# Patient Record
Sex: Male | Born: 1965 | Race: White | Hispanic: No | Marital: Married | Smoking: Former smoker
Health system: Southern US, Community
[De-identification: ages and names within clinical notes are randomized; demographics above are authoritative.]

## PROBLEM LIST (undated history)

## (undated) DIAGNOSIS — F329 Major depressive disorder, single episode, unspecified: Secondary | ICD-10-CM

## (undated) DIAGNOSIS — T753XXA Motion sickness, initial encounter: Secondary | ICD-10-CM

## (undated) DIAGNOSIS — F419 Anxiety disorder, unspecified: Secondary | ICD-10-CM

## (undated) DIAGNOSIS — N529 Male erectile dysfunction, unspecified: Secondary | ICD-10-CM

## (undated) DIAGNOSIS — L719 Rosacea, unspecified: Secondary | ICD-10-CM

## (undated) DIAGNOSIS — G4730 Sleep apnea, unspecified: Secondary | ICD-10-CM

## (undated) DIAGNOSIS — E349 Endocrine disorder, unspecified: Secondary | ICD-10-CM

## (undated) HISTORY — PX: HERNIA REPAIR: SHX51

## (undated) HISTORY — PX: APPENDECTOMY: SHX54

---
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
## 2004-08-13 ENCOUNTER — Emergency Department: Payer: Self-pay | Admitting: General Practice

## 2007-04-22 ENCOUNTER — Inpatient Hospital Stay: Payer: Self-pay | Admitting: Surgery

## 2013-12-26 DIAGNOSIS — G4733 Obstructive sleep apnea (adult) (pediatric): Secondary | ICD-10-CM | POA: Insufficient documentation

## 2013-12-26 DIAGNOSIS — R7989 Other specified abnormal findings of blood chemistry: Secondary | ICD-10-CM | POA: Insufficient documentation

## 2015-03-12 ENCOUNTER — Encounter: Payer: Self-pay | Admitting: *Deleted

## 2015-03-15 ENCOUNTER — Ambulatory Visit: Admission: RE | Admit: 2015-03-15 | Source: Ambulatory Visit | Admitting: Unknown Physician Specialty

## 2015-03-15 ENCOUNTER — Encounter: Admission: RE | Payer: Self-pay | Source: Ambulatory Visit

## 2015-03-15 HISTORY — DX: Endocrine disorder, unspecified: E34.9

## 2015-03-15 HISTORY — DX: Male erectile dysfunction, unspecified: N52.9

## 2015-03-15 HISTORY — DX: Rosacea, unspecified: L71.9

## 2015-03-15 SURGERY — COLONOSCOPY WITH PROPOFOL
Anesthesia: General

## 2015-04-09 ENCOUNTER — Encounter: Payer: Self-pay | Admitting: *Deleted

## 2015-04-12 ENCOUNTER — Encounter
Admission: RE | Disposition: A | Discharge status: Home / Self Care | Payer: Self-pay | Source: Ambulatory Visit | Attending: Unknown Physician Specialty

## 2015-04-12 ENCOUNTER — Ambulatory Visit: Admitting: Anesthesiology

## 2015-04-12 ENCOUNTER — Ambulatory Visit
Admission: RE | Admit: 2015-04-12 | Discharge: 2015-04-12 | Disposition: A | Discharge status: Home / Self Care | Source: Ambulatory Visit | Attending: Unknown Physician Specialty | Admitting: Unknown Physician Specialty

## 2015-04-12 DIAGNOSIS — Z79899 Other long term (current) drug therapy: Secondary | ICD-10-CM | POA: Diagnosis not present

## 2015-04-12 DIAGNOSIS — L719 Rosacea, unspecified: Secondary | ICD-10-CM | POA: Diagnosis not present

## 2015-04-12 DIAGNOSIS — Z1211 Encounter for screening for malignant neoplasm of colon: Secondary | ICD-10-CM | POA: Insufficient documentation

## 2015-04-12 DIAGNOSIS — D120 Benign neoplasm of cecum: Secondary | ICD-10-CM | POA: Diagnosis not present

## 2015-04-12 DIAGNOSIS — E291 Testicular hypofunction: Secondary | ICD-10-CM | POA: Diagnosis not present

## 2015-04-12 DIAGNOSIS — K640 First degree hemorrhoids: Secondary | ICD-10-CM | POA: Insufficient documentation

## 2015-04-12 HISTORY — PX: COLONOSCOPY WITH PROPOFOL: SHX5780

## 2015-04-12 SURGERY — COLONOSCOPY WITH PROPOFOL
Anesthesia: General

## 2015-04-12 MED ORDER — FENTANYL CITRATE (PF) 100 MCG/2ML IJ SOLN
INTRAMUSCULAR | Status: DC | PRN
  Administered 2015-04-12: 50 ug via INTRAVENOUS

## 2015-04-12 MED ORDER — PROPOFOL 10 MG/ML IV BOLUS
INTRAVENOUS | Status: DC | PRN
  Administered 2015-04-12: 43.1 mg via INTRAVENOUS

## 2015-04-12 MED ORDER — SODIUM CHLORIDE 0.9 % IV SOLN
INTRAVENOUS | Status: DC
  Administered 2015-04-12: 12:00:00 via INTRAVENOUS

## 2015-04-12 MED ORDER — SODIUM CHLORIDE 0.9 % IV SOLN: INTRAVENOUS | Status: DC

## 2015-04-12 MED ORDER — MIDAZOLAM HCL 5 MG/5ML IJ SOLN
INTRAMUSCULAR | Status: DC | PRN
  Administered 2015-04-12: 1 mg via INTRAVENOUS

## 2015-04-12 MED ORDER — PROPOFOL 500 MG/50ML IV EMUL
INTRAVENOUS | Status: DC | PRN
  Administered 2015-04-12: 120 ug/kg/min via INTRAVENOUS

## 2015-04-12 NOTE — Op Note (Signed)
[facility] Gastroenterology Patient Name: Derek Ford Procedure Date: 04/12/2015 12:34 PM MRN: [id number] Account #: [id number] Date of Birth: 1965-06-18 Admit Type: Outpatient Age: 50 Room: [facility] ENDO ROOM 1 Gender: Male Note Status: Finalized Procedure:         Colonoscopy Indications:       Screening for colorectal malignant neoplasm Providers:         Manya Silvas, MD Referring MD:      Caprice Renshaw (Referring MD) Medicines:         Propofol per Anesthesia Complications:     No immediate complications. Procedure:         Pre-Anesthesia Assessment:                    - After reviewing the risks and benefits, the patient was                     deemed in satisfactory condition to undergo the procedure.                    After obtaining informed consent, the colonoscope was                     passed under direct vision. Throughout the procedure, the                     patient's blood pressure, pulse, and oxygen saturations                     were monitored continuously. The Colonoscope was                     introduced through the anus and advanced to the the cecum,                     identified by appendiceal orifice and ileocecal valve. The                     colonoscopy was performed without difficulty. The patient                     tolerated the procedure well. The quality of the bowel                     preparation was excellent. Findings:      A diminutive polyp was found in the cecum. The polyp was sessile. The       polyp was removed with a cold biopsy forceps. Resection and retrieval       were complete.      Internal hemorrhoids were found during endoscopy. The hemorrhoids were       small and Grade I (internal hemorrhoids that do not prolapse).      The exam was otherwise without abnormality. Impression:        - One diminutive polyp in the cecum. Resected and                     retrieved.                    - Internal  hemorrhoids.                    - The examination was otherwise normal. Recommendation:    - Await pathology results. Manya Silvas, MD  04/12/2015 1:19:51 PM This report has been signed electronically. Number of Addenda: 0 Note Initiated On: 04/12/2015 12:34 PM Scope Withdrawal Time: 0 hours 11 minutes 26 seconds  Total Procedure Duration: 0 hours 17 minutes 9 seconds       [facility]

## 2015-04-12 NOTE — Anesthesia Preprocedure Evaluation (Signed)
Anesthesia Evaluation  Patient identified by MRN, date of birth, ID band Patient awake    Reviewed: Allergy & Precautions, NPO status , Patient's Chart, lab work & pertinent test results  History of Anesthesia Complications Negative for: history of anesthetic complications  Airway Mallampati: II       Dental   Pulmonary neg pulmonary ROS,           Cardiovascular negative cardio ROS       Neuro/Psych negative neurological ROS     GI/Hepatic negative GI ROS, Neg liver ROS,   Endo/Other  negative endocrine ROS  Renal/GU negative Renal ROS     Musculoskeletal   Abdominal   Peds  Hematology negative hematology ROS (+)   Anesthesia Other Findings   Reproductive/Obstetrics                             Anesthesia Physical Anesthesia Plan  ASA: I  Anesthesia Plan: General   Post-op Pain Management:    Induction: Intravenous  Airway Management Planned: Nasal Cannula  Additional Equipment:   Intra-op Plan:   Post-operative Plan:   Informed Consent: I have reviewed the patients History and Physical, chart, labs and discussed the procedure including the risks, benefits and alternatives for the proposed anesthesia with the patient or authorized representative who has indicated his/her understanding and acceptance.     Plan Discussed with:   Anesthesia Plan Comments:         Anesthesia Quick Evaluation  

## 2015-04-12 NOTE — H&P (Signed)
   Primary Care Physician:  Marcello Fennel, MD Primary Gastroenterologist:  Dr. Vira Agar  Pre-Procedure History & Physical: HPI:  Derek Ford is a 50 y.o. male is here for an colonoscopy.   Past Medical History  Diagnosis Date  . Erectile dysfunction   . Hypotestosteronism   . Rosacea     Past Surgical History  Procedure Laterality Date  . Hernia repair    . Appendectomy      Prior to Admission medications   Medication Sig Start Date End Date Taking? Authorizing Provider  Ginkgo Biloba 40 MG TABS Take 40 mg by mouth daily.    Historical Provider, MD  Multiple Vitamin (MULTIVITAMIN) capsule Take 1 capsule by mouth daily.    Historical Provider, MD  omega-3 acid ethyl esters (LOVAZA) 1 g capsule Take 2 g by mouth daily.    Historical Provider, MD  sildenafil (VIAGRA) 100 MG tablet Take 100 mg by mouth as needed for erectile dysfunction.    Historical Provider, MD  Testosterone (FORTESTA) 10 MG/ACT (2%) GEL Place 10 applicators onto the skin daily.    Historical Provider, MD  Testosterone 10 MG/ACT (2%) GEL Place onto the skin.    Historical Provider, MD    Allergies as of 03/22/2015  . (No Known Allergies)    History reviewed. No pertinent family history.  Social History   Social History  . Marital Status: Married    Spouse Name: N/A  . Number of Children: N/A  . Years of Education: N/A   Occupational History  . Not on file.   Social History Main Topics  . Smoking status: Not on file  . Smokeless tobacco: Not on file  . Alcohol Use: No  . Drug Use: No  . Sexual Activity: Not on file   Other Topics Concern  . Not on file   Social History Narrative    Review of Systems: See HPI, otherwise negative ROS  Physical Exam: BP 121/90 mmHg  Pulse 83  Temp(Src) 98.4 F (36.9 C) (Tympanic)  Resp 19  Ht 6\' 1"  (1.854 m)  Wt 86.183 kg (190 lb)  BMI 25.07 kg/m2  SpO2 100% General:   Alert,  pleasant and cooperative in NAD Head:  Normocephalic and  atraumatic. Neck:  Supple; no masses or thyromegaly. Lungs:  Clear throughout to auscultation.    Heart:  Regular rate and rhythm. Abdomen:  Soft, nontender and nondistended. Normal bowel sounds, without guarding, and without rebound.   Neurologic:  Alert and  oriented x4;  grossly normal neurologically.  Impression/Plan: [name] is here for an colonoscopy to be performed for screening  Risks, benefits, limitations, and alternatives regarding  colonoscopy have been reviewed with the patient.  Questions have been answered.  All parties agreeable.   Gaylyn Cheers, MD  04/12/2015, 12:52 PM

## 2015-04-12 NOTE — Transfer of Care (Signed)
Immediate Anesthesia Transfer of Care Note  Patient: Derek Ford  Procedure(s) Performed: Procedure(s): COLONOSCOPY WITH PROPOFOL (N/A)  Patient Location: PACU  Anesthesia Type:General  Level of Consciousness: sedated  Airway & Oxygen Therapy: Patient Spontanous Breathing  Post-op Assessment: Report given to RN  Post vital signs: Reviewed  Last Vitals:  Filed Vitals:   04/12/15 1157 04/12/15 1323  BP: 121/90 95/65  Pulse: 83 86  Temp: 36.9 C 36.1 C  Resp: 19 20    Complications: No apparent anesthesia complications

## 2015-04-12 NOTE — Anesthesia Postprocedure Evaluation (Signed)
Anesthesia Post Note  Patient: Derek Ford  Procedure(s) Performed: Procedure(s) (LRB): COLONOSCOPY WITH PROPOFOL (N/A)  Patient location during evaluation: Endoscopy Anesthesia Type: General Level of consciousness: awake and alert Pain management: pain level controlled Vital Signs Assessment: post-procedure vital signs reviewed and stable Respiratory status: spontaneous breathing and respiratory function stable Cardiovascular status: stable Anesthetic complications: no    Last Vitals:  Filed Vitals:   04/12/15 1157 04/12/15 1323  BP: 121/90 95/65  Pulse: 83 86  Temp: 36.9 C 36.1 C  Resp: 19 20    Last Pain: There were no vitals filed for this visit.               Quanisha Drewry K

## 2015-04-13 ENCOUNTER — Encounter: Payer: Self-pay | Admitting: Unknown Physician Specialty

## 2015-04-13 LAB — SURGICAL PATHOLOGY

## 2016-12-26 DIAGNOSIS — S62609A Fracture of unspecified phalanx of unspecified finger, initial encounter for closed fracture: Secondary | ICD-10-CM | POA: Insufficient documentation

## 2017-08-26 ENCOUNTER — Other Ambulatory Visit: Payer: Self-pay

## 2017-08-26 ENCOUNTER — Emergency Department
Admission: EM | Admit: 2017-08-26 | Discharge: 2017-08-26 | Disposition: A | Discharge status: Home / Self Care | Attending: Emergency Medicine | Admitting: Emergency Medicine

## 2017-08-26 DIAGNOSIS — Y999 Unspecified external cause status: Secondary | ICD-10-CM | POA: Insufficient documentation

## 2017-08-26 DIAGNOSIS — S60562A Insect bite (nonvenomous) of left hand, initial encounter: Secondary | ICD-10-CM | POA: Diagnosis present

## 2017-08-26 DIAGNOSIS — Y939 Activity, unspecified: Secondary | ICD-10-CM | POA: Diagnosis not present

## 2017-08-26 DIAGNOSIS — Y929 Unspecified place or not applicable: Secondary | ICD-10-CM | POA: Diagnosis not present

## 2017-08-26 DIAGNOSIS — R2232 Localized swelling, mass and lump, left upper limb: Secondary | ICD-10-CM | POA: Insufficient documentation

## 2017-08-26 DIAGNOSIS — W57XXXA Bitten or stung by nonvenomous insect and other nonvenomous arthropods, initial encounter: Secondary | ICD-10-CM | POA: Insufficient documentation

## 2017-08-26 MED ORDER — FAMOTIDINE IN NACL 20-0.9 MG/50ML-% IV SOLN
20.0000 mg | Freq: Once | INTRAVENOUS | Status: AC
  Administered 2017-08-26: 20 mg via INTRAVENOUS
  Filled 2017-08-26: qty 50

## 2017-08-26 MED ORDER — PREDNISONE 20 MG PO TABS: 40.0000 mg | ORAL_TABLET | Freq: Every day | ORAL | 0 refills | Status: DC

## 2017-08-26 MED ORDER — METHYLPREDNISOLONE SODIUM SUCC 125 MG IJ SOLR
125.0000 mg | Freq: Once | INTRAMUSCULAR | Status: AC
  Administered 2017-08-26: 125 mg via INTRAVENOUS
  Filled 2017-08-26 (×2): qty 2

## 2017-08-26 MED ORDER — DIPHENHYDRAMINE HCL 50 MG/ML IJ SOLN
25.0000 mg | Freq: Once | INTRAMUSCULAR | Status: AC
  Administered 2017-08-26: 25 mg via INTRAVENOUS
  Filled 2017-08-26: qty 1

## 2017-08-26 MED ORDER — SODIUM CHLORIDE 0.9 % IV BOLUS
1000.0000 mL | Freq: Once | INTRAVENOUS | Status: AC
  Administered 2017-08-26: 1000 mL via INTRAVENOUS

## 2017-08-26 NOTE — ED Notes (Signed)
Pt states he got bitten by something around 6pm tonight and that his hand started to swell soon after.  Pt is A&Ox4, in NAD.

## 2017-08-26 NOTE — ED Provider Notes (Signed)
[facility] Emergency Department Provider Note  Time seen: 9:29 PM  I have reviewed the triage vital signs and the nursing notes.   HISTORY  Chief Complaint Insect Bite    HPI Derek Ford is a 52 y.o. male with no significant past medical history presents to the emergency department with left hand and arm swelling after being stung by an insect.  According to the patient he opened a metal gate at his house around 6:30 PM tonight, states he felt a sharp pinch in his left wrist.  States he looked down and saw a single welt consistent with an insect sting or bite.  States he had significant pain but did not think much of it.  He states shortly afterwards he began having significant swelling around his wrist and into his hand, he took his jewelry off including his wedding ring (patient is an ex-medic), and took 50 mg of Benadryl around 7 PM.  States he continue to watch at home but it continued to increase in swelling so he came to the emergency department for evaluation.  Denies any known allergies, states he has been stung by many insects in the past and never had a big response like this.  Patient does have swelling throughout his left hand and halfway up his forearm.   Past Medical History:  Diagnosis Date  . Erectile dysfunction   . Hypotestosteronism   . Rosacea     There are no active problems to display for this patient.   Past Surgical History:  Procedure Laterality Date  . APPENDECTOMY    . COLONOSCOPY WITH PROPOFOL N/A 04/12/2015   Procedure: COLONOSCOPY WITH PROPOFOL;  Surgeon: Manya Silvas, MD;  Location: [facility] ENDOSCOPY;  Service: Endoscopy;  Laterality: N/A;  . HERNIA REPAIR      Prior to Admission medications   Medication Sig Start Date End Date Taking? Authorizing Provider  Ginkgo Biloba 40 MG TABS Take 40 mg by mouth daily.    [provider]  Multiple Vitamin (MULTIVITAMIN) capsule Take 1 capsule by mouth daily.    [provider]  omega-3 acid ethyl esters (LOVAZA) 1 g capsule Take 2 g by mouth daily.    [provider]  sildenafil (VIAGRA) 100 MG tablet Take 100 mg by mouth as needed for erectile dysfunction.    [provider]  Testosterone (FORTESTA) 10 MG/ACT (2%) GEL Place 10 applicators onto the skin daily.    [provider]  Testosterone 10 MG/ACT (2%) GEL Place onto the skin.    [provider]    No Known Allergies  No family history on file.  Social History Social History   Tobacco Use  . Smoking status: Not on file  Substance Use Topics  . Alcohol use: No  . Drug use: No    Review of Systems Constitutional: Negative for fever. Cardiovascular: Negative for chest pain. Respiratory: Negative for shortness of breath.  Negative for wheeze. Gastrointestinal: Negative for abdominal pain, vomiting  Musculoskeletal: Left wrist and hand pain and swelling Skin: Swelling of the left hand, single small area of possible bite/sting Neurological: Negative for headache All other ROS negative  ____________________________________________   PHYSICAL EXAM:  VITAL SIGNS: ED Triage Vitals  Enc Vitals Group     BP 08/26/17 2107 (!) 156/102     Pulse Rate 08/26/17 2107 88     Resp 08/26/17 2107 18     Temp 08/26/17 2107 99 F (37.2 C)  Temp Source 08/26/17 2107 Oral     SpO2 08/26/17 2107 99 %     Weight 08/26/17 2108 210 lb (95.3 kg)     Height 08/26/17 2108 6\' 1"  (1.854 m)     Head Circumference --      Peak Flow --      Pain Score 08/26/17 2107 8     Pain Loc --      Pain Edu? --      Excl. in [place]? --     Constitutional: Alert and oriented. Well appearing and in no distress. Eyes: Normal exam ENT   Head: Normocephalic and atraumatic.   Mouth/Throat: Mucous membranes are moist. Cardiovascular: Normal rate, regular rhythm. No murmur Respiratory: Normal respiratory effort without tachypnea nor retractions. Breath sounds are  clear Gastrointestinal: Soft and nontender. No distention. Musculoskeletal: Significant swelling of the left hand extending approximately halfway up his forearm.  Patient took his watch and wedding ring off already.  Appears to be neurovascularly intact distally.  Sensation intact.  No obvious stinger bite mark currently. Neurologic:  Normal speech and language. No gross focal neurologic deficits  Skin:  Skin is warm, dry  Psychiatric: Mood and affect are normal.  ____________________________________________   INITIAL IMPRESSION / ASSESSMENT AND PLAN / ED COURSE  Pertinent labs & imaging results that were available during my care of the patient were reviewed by me and considered in my medical decision making (see chart for details).  Patient with significant swelling of the left hand and forearm approximately 3 hours after likely insect bite per patient.  Differential would include allergic reaction, localized histamine response, envenomation.  We will place an IV, treat with fluids, steroids, 25 mg of IV Benadryl as well as Pepcid.  We will also apply ice to the hand and arm.  Swelling remains moderate but does not appear to be extending.  Patient is comfortable going home, I discussed elevating it and keeping it iced.  He will return for any progression of the swelling any trouble breathing any hives or airway swelling.  ____________________________________________   FINAL CLINICAL IMPRESSION(S) / ED DIAGNOSES  Allergic reaction Insect sting    Harvest Dark, MD 08/26/17 2250

## 2017-08-26 NOTE — ED Notes (Signed)
Pt discharged to home.  Family member driving.  Discharge instructions reviewed.  Verbalized understanding.  No questions or concerns at this time.  Teach back verified.  Pt in NAD.  No items left in ED.   

## 2017-08-26 NOTE — ED Triage Notes (Signed)
Patient to ED for an insect sting to left hand about 1830. Unsure what it was but states it felt like a "nail got driven into his hand." Hand is red, swollen and painful. Unable to make a fist. Redness is extending up past the wrist and swelling has extended up to elbow. Patient states he took 50 mg po Benadryl.

## 2017-10-22 ENCOUNTER — Encounter: Payer: Self-pay | Admitting: *Deleted

## 2017-10-22 ENCOUNTER — Other Ambulatory Visit: Payer: Self-pay

## 2017-10-22 NOTE — Anesthesia Preprocedure Evaluation (Addendum)
Anesthesia Evaluation  Patient identified by MRN, date of birth, ID band Patient awake    Reviewed: Allergy & Precautions, H&P , NPO status , Patient's Chart, lab work & pertinent test results  Airway Mallampati: II  TM Distance: >3 FB Neck ROM: full    Dental no notable dental hx.    Pulmonary sleep apnea , former smoker,  Mild OSA   Pulmonary exam normal breath sounds clear to auscultation       Cardiovascular Normal cardiovascular exam Rhythm:regular Rate:Normal     Neuro/Psych    GI/Hepatic   Endo/Other    Renal/GU      Musculoskeletal   Abdominal   Peds  Hematology   Anesthesia Other Findings   Reproductive/Obstetrics                            Anesthesia Physical Anesthesia Plan  ASA: II  Anesthesia Plan: General   Post-op Pain Management:    Induction:   PONV Risk Score and Plan: 2 and Treatment may vary due to age or medical condition, Dexamethasone, Ondansetron and Midazolam  Airway Management Planned:   Additional Equipment:   Intra-op Plan:   Post-operative Plan:   Informed Consent: I have reviewed the patients History and Physical, chart, labs and discussed the procedure including the risks, benefits and alternatives for the proposed anesthesia with the patient or authorized representative who has indicated his/her understanding and acceptance.     Plan Discussed with: CRNA  Anesthesia Plan Comments:         Anesthesia Quick Evaluation

## 2017-10-23 NOTE — Discharge Instructions (Signed)
General Anesthesia, Adult, Care After °These instructions provide you with information about caring for yourself after your procedure. Your health care provider may also give you more specific instructions. Your treatment has been planned according to current medical practices, but problems sometimes occur. Call your health care provider if you have any problems or questions after your procedure. °What can I expect after the procedure? °After the procedure, it is common to have: °· Vomiting. °· A sore throat. °· Mental slowness. ° °It is common to feel: °· Nauseous. °· Cold or shivery. °· Sleepy. °· Tired. °· Sore or achy, even in parts of your body where you did not have surgery. ° °Follow these instructions at home: °For at least 24 hours after the procedure: °· Do not: °? Participate in activities where you could fall or become injured. °? Drive. °? Use heavy machinery. °? Drink alcohol. °? Take sleeping pills or medicines that cause drowsiness. °? Make important decisions or sign legal documents. °? Take care of children on your own. °· Rest. °Eating and drinking °· If you vomit, drink water, juice, or soup when you can drink without vomiting. °· Drink enough fluid to keep your urine clear or pale yellow. °· Make sure you have little or no nausea before eating solid foods. °· Follow the diet recommended by your health care provider. °General instructions °· Have a responsible adult stay with you until you are awake and alert. °· Return to your normal activities as told by your health care provider. Ask your health care provider what activities are safe for you. °· Take over-the-counter and prescription medicines only as told by your health care provider. °· If you smoke, do not smoke without supervision. °· Keep all follow-up visits as told by your health care provider. This is important. °Contact a health care provider if: °· You continue to have nausea or vomiting at home, and medicines are not helpful. °· You  cannot drink fluids or start eating again. °· You cannot urinate after 8-12 hours. °· You develop a skin rash. °· You have fever. °· You have increasing redness at the site of your procedure. °Get help right away if: °· You have difficulty breathing. °· You have chest pain. °· You have unexpected bleeding. °· You feel that you are having a life-threatening or urgent problem. °This information is not intended to replace advice given to you by your health care provider. Make sure you discuss any questions you have with your health care provider. °Document Released: 05/29/2000 Document Revised: 07/26/2015 Document Reviewed: 02/04/2015 °Elsevier Interactive Patient Education © 2018 [employer] ° °

## 2017-10-24 ENCOUNTER — Ambulatory Visit
Admission: RE | Admit: 2017-10-24 | Discharge: 2017-10-24 | Disposition: A | Discharge status: Home / Self Care | Source: Ambulatory Visit | Attending: Otolaryngology | Admitting: Otolaryngology

## 2017-10-24 ENCOUNTER — Ambulatory Visit: Admitting: Anesthesiology

## 2017-10-24 ENCOUNTER — Encounter
Admission: RE | Disposition: A | Discharge status: Home / Self Care | Payer: Self-pay | Source: Ambulatory Visit | Attending: Otolaryngology

## 2017-10-24 DIAGNOSIS — Q386 Other congenital malformations of mouth: Secondary | ICD-10-CM | POA: Insufficient documentation

## 2017-10-24 DIAGNOSIS — F458 Other somatoform disorders: Secondary | ICD-10-CM | POA: Diagnosis not present

## 2017-10-24 DIAGNOSIS — R0683 Snoring: Secondary | ICD-10-CM | POA: Diagnosis not present

## 2017-10-24 DIAGNOSIS — Z87891 Personal history of nicotine dependence: Secondary | ICD-10-CM | POA: Insufficient documentation

## 2017-10-24 DIAGNOSIS — G4733 Obstructive sleep apnea (adult) (pediatric): Secondary | ICD-10-CM | POA: Diagnosis not present

## 2017-10-24 HISTORY — DX: Sleep apnea, unspecified: G47.30

## 2017-10-24 HISTORY — PX: UVULOPLASTY: SHX6557

## 2017-10-24 HISTORY — DX: Motion sickness, initial encounter: T75.3XXA

## 2017-10-24 SURGERY — REPAIR, UVULA
Anesthesia: General | Site: Throat | Wound class: "Clean Contaminated "

## 2017-10-24 MED ORDER — DEXAMETHASONE SODIUM PHOSPHATE 4 MG/ML IJ SOLN
INTRAMUSCULAR | Status: DC | PRN
  Administered 2017-10-24: 10 mg via INTRAVENOUS

## 2017-10-24 MED ORDER — LIDOCAINE VISCOUS HCL 2 % MT SOLN: 10.0000 mL | Freq: Four times a day (QID) | OROMUCOSAL | 0 refills | Status: DC | PRN

## 2017-10-24 MED ORDER — OXYMETAZOLINE HCL 0.05 % NA SOLN
NASAL | Status: DC | PRN
  Administered 2017-10-24: 1 via TOPICAL

## 2017-10-24 MED ORDER — OXYCODONE HCL 5 MG PO TABS: 5.0000 mg | ORAL_TABLET | Freq: Once | ORAL | Status: AC | PRN

## 2017-10-24 MED ORDER — ONDANSETRON HCL 4 MG PO TABS: 4.0000 mg | ORAL_TABLET | Freq: Three times a day (TID) | ORAL | 0 refills | Status: DC | PRN

## 2017-10-24 MED ORDER — ONDANSETRON HCL 4 MG/2ML IJ SOLN: 4.0000 mg | Freq: Once | INTRAMUSCULAR | Status: DC | PRN

## 2017-10-24 MED ORDER — DOCUSATE SODIUM 100 MG PO CAPS: 100.0000 mg | ORAL_CAPSULE | Freq: Two times a day (BID) | ORAL | 0 refills | Status: DC

## 2017-10-24 MED ORDER — OXYCODONE HCL 5 MG/5ML PO SOLN
5.0000 mg | Freq: Once | ORAL | Status: AC | PRN
  Administered 2017-10-24: 5 mg via ORAL

## 2017-10-24 MED ORDER — LIDOCAINE HCL (CARDIAC) PF 100 MG/5ML IV SOSY
PREFILLED_SYRINGE | INTRAVENOUS | Status: DC | PRN
  Administered 2017-10-24: 40 mg via INTRAVENOUS

## 2017-10-24 MED ORDER — ACETAMINOPHEN 160 MG/5ML PO SOLN: 325.0000 mg | ORAL | Status: DC | PRN

## 2017-10-24 MED ORDER — HYDROCODONE-ACETAMINOPHEN 7.5-325 MG/15ML PO SOLN: 10.0000 mL | Freq: Four times a day (QID) | ORAL | 0 refills | Status: DC | PRN

## 2017-10-24 MED ORDER — FENTANYL CITRATE (PF) 100 MCG/2ML IJ SOLN
INTRAMUSCULAR | Status: DC | PRN
  Administered 2017-10-24: 25 ug via INTRAVENOUS
  Administered 2017-10-24: 50 ug via INTRAVENOUS

## 2017-10-24 MED ORDER — PROPOFOL 10 MG/ML IV BOLUS
INTRAVENOUS | Status: DC | PRN
  Administered 2017-10-24: 150 mg via INTRAVENOUS

## 2017-10-24 MED ORDER — FENTANYL CITRATE (PF) 100 MCG/2ML IJ SOLN: 25.0000 ug | INTRAMUSCULAR | Status: DC | PRN

## 2017-10-24 MED ORDER — ACETAMINOPHEN 325 MG PO TABS: 325.0000 mg | ORAL_TABLET | ORAL | Status: DC | PRN

## 2017-10-24 MED ORDER — SUCCINYLCHOLINE CHLORIDE 20 MG/ML IJ SOLN
INTRAMUSCULAR | Status: DC | PRN
  Administered 2017-10-24: 100 mg via INTRAVENOUS

## 2017-10-24 MED ORDER — MIDAZOLAM HCL 5 MG/5ML IJ SOLN
INTRAMUSCULAR | Status: DC | PRN
  Administered 2017-10-24: 2 mg via INTRAVENOUS

## 2017-10-24 MED ORDER — ONDANSETRON HCL 4 MG/2ML IJ SOLN
INTRAMUSCULAR | Status: DC | PRN
  Administered 2017-10-24: 4 mg via INTRAVENOUS

## 2017-10-24 MED ORDER — LACTATED RINGERS IV SOLN
INTRAVENOUS | Status: DC
  Administered 2017-10-24: 07:00:00 via INTRAVENOUS

## 2017-10-24 MED ORDER — GLYCOPYRROLATE 0.2 MG/ML IJ SOLN
INTRAMUSCULAR | Status: DC | PRN
  Administered 2017-10-24: 0.1 mg via INTRAVENOUS

## 2017-10-24 SURGICAL SUPPLY — 23 items
BLADE BOVIE TIP EXT 4 (BLADE) ×3 IMPLANT
CANISTER SUCT 1200ML W/VALVE (MISCELLANEOUS) ×3 IMPLANT
CATH ROBINSON RED A/P 10FR (CATHETERS) ×3 IMPLANT
COAG SUCT 10F 3.5MM HAND CTRL (MISCELLANEOUS) ×3 IMPLANT
ELECT CAUTERY NDL 2.0 MIC (NEEDLE) IMPLANT
ELECT CAUTERY NEEDLE 2.0 MIC (NEEDLE) ×3 IMPLANT
ELECT REM PT RETURN 9FT ADLT (ELECTROSURGICAL) ×3
ELECTRODE REM PT RTRN 9FT ADLT (ELECTROSURGICAL) ×1 IMPLANT
GLOVE BIOGEL M 7.0 STRL (GLOVE) ×3 IMPLANT
HANDLE SUCTION POOLE (INSTRUMENTS) ×1 IMPLANT
KIT TURNOVER KIT A (KITS) ×3 IMPLANT
NDL HYPO 25GX1X1/2 BEV (NEEDLE) ×1 IMPLANT
NEEDLE HYPO 25GX1X1/2 BEV (NEEDLE) ×3 IMPLANT
NS IRRIG 500ML POUR BTL (IV SOLUTION) ×3 IMPLANT
PACK TONSIL/ADENOIDS (PACKS) ×3 IMPLANT
PENCIL SMOKE EVACUATOR (MISCELLANEOUS) ×3 IMPLANT
SLEEVE SUCTION 125 (MISCELLANEOUS) ×3 IMPLANT
SOL ANTI-FOG 6CC FOG-OUT (MISCELLANEOUS) ×1 IMPLANT
SOL FOG-OUT ANTI-FOG 6CC (MISCELLANEOUS) ×2
SUCTION POOLE HANDLE (INSTRUMENTS) ×3
SUT VIC AB 3-0 SH 27 (SUTURE) ×4
SUT VIC AB 3-0 SH 27X BRD (SUTURE) IMPLANT
SYR 5ML LL (SYRINGE) ×3 IMPLANT

## 2017-10-24 NOTE — Transfer of Care (Signed)
Immediate Anesthesia Transfer of Care Note  Patient: Derek Ford  Procedure(s) Performed: Jonna Munro (N/A Throat)  Patient Location: PACU  Anesthesia Type: General  Level of Consciousness: awake, alert  and patient cooperative  Airway and Oxygen Therapy: Patient Spontanous Breathing and Patient connected to supplemental oxygen  Post-op Assessment: Post-op Vital signs reviewed, Patient's Cardiovascular Status Stable, Respiratory Function Stable, Patent Airway and No signs of Nausea or vomiting  Post-op Vital Signs: Reviewed and stable  Complications: No apparent anesthesia complications

## 2017-10-24 NOTE — Anesthesia Procedure Notes (Signed)
Procedure Name: Intubation Date/Time: 10/24/2017 8:16 AM Performed by: Mayme Genta, CRNA Pre-anesthesia Checklist: Patient identified, Emergency Drugs available, Suction available, Patient being monitored and Timeout performed Patient Re-evaluated:Patient Re-evaluated prior to induction Oxygen Delivery Method: Circle system utilized Preoxygenation: Pre-oxygenation with 100% oxygen Induction Type: IV induction Ventilation: Mask ventilation without difficulty Laryngoscope Size: Miller and 3 Grade View: Grade I Tube type: Oral Rae Tube size: 7.5 mm Number of attempts: 1 Placement Confirmation: ETT inserted through vocal cords under direct vision,  positive ETCO2 and breath sounds checked- equal and bilateral Tube secured with: Tape Dental Injury: Teeth and Oropharynx as per pre-operative assessment

## 2017-10-24 NOTE — Op Note (Signed)
..  10/24/2017  8:30 AM    Derek Ford  [id number]   Pre-Op Dx:  LONG UVULA, obstructive sleep apnea, globus, dysphagia, snoring  Post-op Dx: same  Proc:1)  Uvuloplasty  Surg: Derek Ford  Anes:  General Endotracheal  EBL:  <84ml  Comp:  None  Findings:  Elongated uvula resting on epiglottis, trimmed and beveled.  Normal sized palate and tonsils.  Procedure: After the patient was identified in holding and the history and physical and consent was reviewed, the patient was taken to the operating room and placed in a supine position.  General endotracheal anesthesia was induced in the normal fashion.  At this time, the patient was rotated 45 degrees and a shoulder roll was placed.  At this time, a McIvor mouthgag was inserted into the patient's oral cavity and suspended from the [place] stand without injury to teeth, lips, or gums.  The patient's oral cavity was evaluated.  This demonstrated an enlarged tongue and base of tongue but also a significantly elongated uvula extending down into the patient's valleculae and resting on his epiglottis.  At this time, a long forceps was used to gently grasp the patient's uvula.  This was trimmed in a bevel fashion anterior to posterior and sent for pathological evaluation.  The mucosa was next closed with interrupted vicryl sutures.  The patient tolerated the procedure well.  Following this  The care of patient was returned to anesthesia, awakened, and transferred to recovery in stable condition.  Dispo:  PACU to home  Plan: Soft diet.  Fluid hydration  Recheck my office three weeks.   Tellis Spivak 8:30 AM 10/24/2017 \

## 2017-10-24 NOTE — H&P (Signed)
..  History and Physical paper copy reviewed and updated date of procedure and will be scanned into system.  Patient seen and examined.  

## 2017-10-24 NOTE — Anesthesia Postprocedure Evaluation (Signed)
Anesthesia Post Note  Patient: Derek Ford  Procedure(s) Performed: Jonna Munro (N/A Throat)  Patient location during evaluation: PACU Anesthesia Type: General Level of consciousness: awake and alert and oriented Pain management: satisfactory to patient Vital Signs Assessment: post-procedure vital signs reviewed and stable Respiratory status: spontaneous breathing, nonlabored ventilation and respiratory function stable Cardiovascular status: blood pressure returned to baseline and stable Postop Assessment: Adequate PO intake and No signs of nausea or vomiting Anesthetic complications: no    Raliegh Ip

## 2017-10-25 ENCOUNTER — Encounter: Payer: Self-pay | Admitting: Otolaryngology

## 2017-10-27 LAB — SURGICAL PATHOLOGY

## 2018-10-01 ENCOUNTER — Emergency Department (HOSPITAL_COMMUNITY)

## 2018-10-01 ENCOUNTER — Encounter (HOSPITAL_COMMUNITY): Payer: Self-pay

## 2018-10-01 ENCOUNTER — Inpatient Hospital Stay (HOSPITAL_COMMUNITY)

## 2018-10-01 ENCOUNTER — Inpatient Hospital Stay (HOSPITAL_COMMUNITY)
Admission: EM | Admit: 2018-10-01 | Discharge: 2018-10-06 | DRG: 493 | Disposition: A | Discharge status: Home / Self Care | Attending: Student | Admitting: Student

## 2018-10-01 DIAGNOSIS — S82391A Other fracture of lower end of right tibia, initial encounter for closed fracture: Secondary | ICD-10-CM | POA: Diagnosis present

## 2018-10-01 DIAGNOSIS — S2231XA Fracture of one rib, right side, initial encounter for closed fracture: Secondary | ICD-10-CM

## 2018-10-01 DIAGNOSIS — Z20828 Contact with and (suspected) exposure to other viral communicable diseases: Secondary | ICD-10-CM | POA: Diagnosis present

## 2018-10-01 DIAGNOSIS — S92412A Displaced fracture of proximal phalanx of left great toe, initial encounter for closed fracture: Secondary | ICD-10-CM

## 2018-10-01 DIAGNOSIS — S82451A Displaced comminuted fracture of shaft of right fibula, initial encounter for closed fracture: Secondary | ICD-10-CM | POA: Diagnosis present

## 2018-10-01 DIAGNOSIS — S82401A Unspecified fracture of shaft of right fibula, initial encounter for closed fracture: Secondary | ICD-10-CM

## 2018-10-01 DIAGNOSIS — S32051A Stable burst fracture of fifth lumbar vertebra, initial encounter for closed fracture: Secondary | ICD-10-CM | POA: Diagnosis present

## 2018-10-01 DIAGNOSIS — F329 Major depressive disorder, single episode, unspecified: Secondary | ICD-10-CM | POA: Diagnosis present

## 2018-10-01 DIAGNOSIS — S32001A Stable burst fracture of unspecified lumbar vertebra, initial encounter for closed fracture: Secondary | ICD-10-CM

## 2018-10-01 DIAGNOSIS — Z8781 Personal history of (healed) traumatic fracture: Secondary | ICD-10-CM

## 2018-10-01 DIAGNOSIS — S2241XA Multiple fractures of ribs, right side, initial encounter for closed fracture: Secondary | ICD-10-CM | POA: Diagnosis present

## 2018-10-01 DIAGNOSIS — S27321A Contusion of lung, unilateral, initial encounter: Secondary | ICD-10-CM | POA: Diagnosis present

## 2018-10-01 DIAGNOSIS — T148XXA Other injury of unspecified body region, initial encounter: Secondary | ICD-10-CM

## 2018-10-01 DIAGNOSIS — S82201A Unspecified fracture of shaft of right tibia, initial encounter for closed fracture: Secondary | ICD-10-CM

## 2018-10-01 DIAGNOSIS — G4730 Sleep apnea, unspecified: Secondary | ICD-10-CM | POA: Diagnosis present

## 2018-10-01 DIAGNOSIS — M79676 Pain in unspecified toe(s): Secondary | ICD-10-CM

## 2018-10-01 DIAGNOSIS — Z9889 Other specified postprocedural states: Secondary | ICD-10-CM

## 2018-10-01 DIAGNOSIS — M25532 Pain in left wrist: Secondary | ICD-10-CM

## 2018-10-01 DIAGNOSIS — S82891A Other fracture of right lower leg, initial encounter for closed fracture: Secondary | ICD-10-CM | POA: Diagnosis present

## 2018-10-01 DIAGNOSIS — S93431A Sprain of tibiofibular ligament of right ankle, initial encounter: Secondary | ICD-10-CM | POA: Diagnosis present

## 2018-10-01 DIAGNOSIS — S32059A Unspecified fracture of fifth lumbar vertebra, initial encounter for closed fracture: Secondary | ICD-10-CM

## 2018-10-01 LAB — CBC WITH DIFFERENTIAL/PLATELET
Abs Immature Granulocytes: 0.18 10*3/uL — ABNORMAL HIGH (ref 0.00–0.07)
Basophils Absolute: 0 10*3/uL (ref 0.0–0.1)
Basophils Relative: 0 %
Eosinophils Absolute: 0.1 10*3/uL (ref 0.0–0.5)
Eosinophils Relative: 1 %
HCT: 37.1 % — ABNORMAL LOW (ref 39.0–52.0)
Hemoglobin: 12.6 g/dL — ABNORMAL LOW (ref 13.0–17.0)
Immature Granulocytes: 2 %
Lymphocytes Relative: 23 %
Lymphs Abs: 2.3 10*3/uL (ref 0.7–4.0)
MCH: 30.5 pg (ref 26.0–34.0)
MCHC: 34 g/dL (ref 30.0–36.0)
MCV: 89.8 fL (ref 80.0–100.0)
Monocytes Absolute: 0.6 10*3/uL (ref 0.1–1.0)
Monocytes Relative: 6 %
Neutro Abs: 6.5 10*3/uL (ref 1.7–7.7)
Neutrophils Relative %: 68 %
Platelets: 217 10*3/uL (ref 150–400)
RBC: 4.13 MIL/uL — ABNORMAL LOW (ref 4.22–5.81)
RDW: 12.2 % (ref 11.5–15.5)
WBC: 9.7 10*3/uL (ref 4.0–10.5)
nRBC: 0 % (ref 0.0–0.2)

## 2018-10-01 LAB — BASIC METABOLIC PANEL
Anion gap: 9 (ref 5–15)
BUN: 11 mg/dL (ref 6–20)
CO2: 21 mmol/L — ABNORMAL LOW (ref 22–32)
Calcium: 8.3 mg/dL — ABNORMAL LOW (ref 8.9–10.3)
Chloride: 106 mmol/L (ref 98–111)
Creatinine, Ser: 1.04 mg/dL (ref 0.61–1.24)
GFR calc Af Amer: >60 mL/min (ref >60)
GFR calc non Af Amer: >60 mL/min (ref >60)
Glucose, Bld: 132 mg/dL — ABNORMAL HIGH (ref 70–99)
Potassium: 3 mmol/L — ABNORMAL LOW (ref 3.5–5.1)
Sodium: 136 mmol/L (ref 135–145)

## 2018-10-01 LAB — I-STAT CHEM 8, ED
BUN: 11 mg/dL (ref 6–20)
Calcium, Ion: 1.09 mmol/L — ABNORMAL LOW (ref 1.15–1.40)
Chloride: 103 mmol/L (ref 98–111)
Creatinine, Ser: 1 mg/dL (ref 0.61–1.24)
Glucose, Bld: 124 mg/dL — ABNORMAL HIGH (ref 70–99)
HCT: 37 % — ABNORMAL LOW (ref 39.0–52.0)
Hemoglobin: 12.6 g/dL — ABNORMAL LOW (ref 13.0–17.0)
Potassium: 3 mmol/L — ABNORMAL LOW (ref 3.5–5.1)
Sodium: 138 mmol/L (ref 135–145)
TCO2: 22 mmol/L (ref 22–32)

## 2018-10-01 LAB — SARS CORONAVIRUS 2 BY RT PCR (HOSPITAL ORDER, PERFORMED IN ~~LOC~~ HOSPITAL LAB): SARS Coronavirus 2: NEGATIVE

## 2018-10-01 LAB — MRSA PCR SCREENING: MRSA by PCR: NEGATIVE

## 2018-10-01 LAB — ETHANOL: Alcohol, Ethyl (B): <10 mg/dL (ref <10)

## 2018-10-01 MED ORDER — HYDRALAZINE HCL 20 MG/ML IJ SOLN: 10.0000 mg | INTRAMUSCULAR | Status: DC | PRN

## 2018-10-01 MED ORDER — METHOCARBAMOL 500 MG PO TABS
500.0000 mg | ORAL_TABLET | Freq: Four times a day (QID) | ORAL | Status: DC | PRN
  Administered 2018-10-01 – 2018-10-05 (×4): 500 mg via ORAL
  Filled 2018-10-01 (×4): qty 1

## 2018-10-01 MED ORDER — SODIUM CHLORIDE 0.9 % IV BOLUS
1000.0000 mL | Freq: Once | INTRAVENOUS | Status: AC
  Administered 2018-10-01: 13:00:00 1000 mL via INTRAVENOUS

## 2018-10-01 MED ORDER — KCL IN DEXTROSE-NACL 20-5-0.45 MEQ/L-%-% IV SOLN
INTRAVENOUS | Status: DC
  Administered 2018-10-01 – 2018-10-02 (×2): via INTRAVENOUS
  Filled 2018-10-01 (×3): qty 1000

## 2018-10-01 MED ORDER — TETANUS-DIPHTH-ACELL PERTUSSIS 5-2.5-18.5 LF-MCG/0.5 IM SUSP
0.5000 mL | Freq: Once | INTRAMUSCULAR | Status: AC
  Administered 2018-10-01: 19:00:00 0.5 mL via INTRAMUSCULAR
  Filled 2018-10-01: qty 0.5

## 2018-10-01 MED ORDER — DOCUSATE SODIUM 100 MG PO CAPS
100.0000 mg | ORAL_CAPSULE | Freq: Two times a day (BID) | ORAL | Status: DC
  Administered 2018-10-01 – 2018-10-06 (×9): 100 mg via ORAL
  Filled 2018-10-01 (×9): qty 1

## 2018-10-01 MED ORDER — OXYCODONE HCL 5 MG PO TABS
10.0000 mg | ORAL_TABLET | ORAL | Status: DC | PRN
  Administered 2018-10-01 – 2018-10-04 (×8): 10 mg via ORAL
  Filled 2018-10-01 (×9): qty 2

## 2018-10-01 MED ORDER — ONDANSETRON 4 MG PO TBDP: 4.0000 mg | ORAL_TABLET | Freq: Four times a day (QID) | ORAL | Status: DC | PRN

## 2018-10-01 MED ORDER — ACETAMINOPHEN 500 MG PO TABS
1000.0000 mg | ORAL_TABLET | Freq: Four times a day (QID) | ORAL | Status: DC
  Administered 2018-10-01 – 2018-10-06 (×17): 1000 mg via ORAL
  Filled 2018-10-01 (×17): qty 2

## 2018-10-01 MED ORDER — PANTOPRAZOLE SODIUM 40 MG IV SOLR: 40.0000 mg | Freq: Every day | INTRAVENOUS | Status: DC

## 2018-10-01 MED ORDER — MORPHINE SULFATE (PF) 4 MG/ML IV SOLN
4.0000 mg | Freq: Once | INTRAVENOUS | Status: AC
  Administered 2018-10-01: 13:00:00 4 mg via INTRAVENOUS
  Filled 2018-10-01: qty 1

## 2018-10-01 MED ORDER — MORPHINE SULFATE (PF) 2 MG/ML IV SOLN
4.0000 mg | INTRAVENOUS | Status: DC | PRN
  Administered 2018-10-01 – 2018-10-02 (×2): 4 mg via INTRAVENOUS
  Filled 2018-10-01 (×2): qty 2

## 2018-10-01 MED ORDER — ONDANSETRON HCL 4 MG/2ML IJ SOLN
4.0000 mg | Freq: Once | INTRAMUSCULAR | Status: AC
  Administered 2018-10-01: 4 mg via INTRAVENOUS
  Filled 2018-10-01: qty 2

## 2018-10-01 MED ORDER — METOPROLOL TARTRATE 5 MG/5ML IV SOLN: 5.0000 mg | Freq: Four times a day (QID) | INTRAVENOUS | Status: DC | PRN

## 2018-10-01 MED ORDER — ONDANSETRON HCL 4 MG/2ML IJ SOLN: 4.0000 mg | Freq: Four times a day (QID) | INTRAMUSCULAR | Status: DC | PRN

## 2018-10-01 MED ORDER — OXYCODONE HCL 5 MG PO TABS
5.0000 mg | ORAL_TABLET | ORAL | Status: DC | PRN
  Administered 2018-10-04 – 2018-10-05 (×2): 5 mg via ORAL
  Filled 2018-10-01 (×2): qty 1

## 2018-10-01 MED ORDER — PANTOPRAZOLE SODIUM 40 MG PO TBEC
40.0000 mg | DELAYED_RELEASE_TABLET | Freq: Every day | ORAL | Status: DC
  Administered 2018-10-01 – 2018-10-06 (×5): 40 mg via ORAL
  Filled 2018-10-01 (×5): qty 1

## 2018-10-01 MED ORDER — MORPHINE SULFATE (PF) 2 MG/ML IV SOLN
2.0000 mg | INTRAVENOUS | Status: DC | PRN
  Administered 2018-10-01: 20:00:00 2 mg via INTRAVENOUS
  Filled 2018-10-01: qty 1

## 2018-10-01 MED ORDER — SODIUM CHLORIDE 0.9 % IV BOLUS
1000.0000 mL | Freq: Once | INTRAVENOUS | Status: AC
  Administered 2018-10-01: 16:00:00 1000 mL via INTRAVENOUS

## 2018-10-01 MED ORDER — MORPHINE SULFATE (PF) 2 MG/ML IV SOLN: 1.0000 mg | INTRAVENOUS | Status: DC | PRN

## 2018-10-01 MED ORDER — IOPAMIDOL (ISOVUE-300) INJECTION 61%
100.0000 mL | Freq: Once | INTRAVENOUS | Status: AC | PRN
  Administered 2018-10-01: 14:00:00 100 mL via INTRAVENOUS
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
## 2018-10-01 NOTE — ED Notes (Signed)
Patient transported to X-ray 
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
## 2018-10-01 NOTE — Progress Notes (Signed)
Orthopedic Tech Progress Note Patient Details:  Derek Ford December 15, 1965 [id number]  Ortho Devices Type of Ortho Device: Stirrup splint, Short leg splint Ortho Device/Splint Location: LRE Ortho Device/Splint Interventions: Adjustment, Application, Ordered   Post Interventions Patient Tolerated: Well Instructions Provided: Care of device   [name] 10/01/2018, 2:54 PM
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
## 2018-10-01 NOTE — Progress Notes (Signed)
Orthopedic Tech Progress Note Patient Details:  Derek Ford 03-29-1965 [id number] Called in order to [facility] for a CLAMSHELL TLSO Patient ID: [name], male   DOB: May 02, 1965, 53 y.o.   MRN: [id number]   [name] 10/01/2018, 6:41 PM
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
## 2018-10-01 NOTE — ED Notes (Signed)
Per Neuro Surgery; no pending procedure and may eat;

## 2018-10-02 ENCOUNTER — Encounter (HOSPITAL_COMMUNITY): Payer: Self-pay | Admitting: Registered Nurse

## 2018-10-02 ENCOUNTER — Inpatient Hospital Stay (HOSPITAL_COMMUNITY)

## 2018-10-02 LAB — BASIC METABOLIC PANEL
Anion gap: 7 (ref 5–15)
BUN: 8 mg/dL (ref 6–20)
CO2: 26 mmol/L (ref 22–32)
Calcium: 8.3 mg/dL — ABNORMAL LOW (ref 8.9–10.3)
Chloride: 105 mmol/L (ref 98–111)
Creatinine, Ser: 0.95 mg/dL (ref 0.61–1.24)
GFR calc Af Amer: >60 mL/min (ref >60)
GFR calc non Af Amer: >60 mL/min (ref >60)
Glucose, Bld: 123 mg/dL — ABNORMAL HIGH (ref 70–99)
Potassium: 4.2 mmol/L (ref 3.5–5.1)
Sodium: 138 mmol/L (ref 135–145)

## 2018-10-02 LAB — CBC
HCT: 35.7 % — ABNORMAL LOW (ref 39.0–52.0)
Hemoglobin: 11.8 g/dL — ABNORMAL LOW (ref 13.0–17.0)
MCH: 30.7 pg (ref 26.0–34.0)
MCHC: 33.1 g/dL (ref 30.0–36.0)
MCV: 93 fL (ref 80.0–100.0)
Platelets: 179 10*3/uL (ref 150–400)
RBC: 3.84 MIL/uL — ABNORMAL LOW (ref 4.22–5.81)
RDW: 12.6 % (ref 11.5–15.5)
WBC: 6.5 10*3/uL (ref 4.0–10.5)
nRBC: 0 % (ref 0.0–0.2)

## 2018-10-02 LAB — HIV ANTIBODY (ROUTINE TESTING W REFLEX): HIV Screen 4th Generation wRfx: NONREACTIVE

## 2018-10-02 MED ORDER — CITALOPRAM HYDROBROMIDE 20 MG PO TABS
20.0000 mg | ORAL_TABLET | Freq: Every day | ORAL | Status: DC
  Administered 2018-10-02 – 2018-10-05 (×4): 20 mg via ORAL
  Filled 2018-10-02 (×4): qty 1

## 2018-10-02 NOTE — Progress Notes (Signed)
Brace has arrived but he said he will not stand for the xray.  We put in on for xray now he is laying in bed and wont take the brace off. Patients wife called and said the doctor told him he was allowed one visitor to short stay before or after surgery?  I told her to call main desk because I am not sure of their policy
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
## 2018-10-02 NOTE — Progress Notes (Signed)
Patient has home CPAP in room

## 2018-10-02 NOTE — Progress Notes (Signed)
Orthopedic Tech Progress Note Patient Details:  Derek Ford [birth year] [id number] Called [employer] for patient. Patient ID: DURANT SCIBILIA, male   DOB: 08-07-1965, 53 y.o.   MRN: [id number]   [name] 10/02/2018, 10:07 AM

## 2018-10-02 NOTE — Progress Notes (Signed)
Ortho Trauma Progress Note  Patient seen this morning please see attestation to [facility] consult note.  Plan to perform open reduction internal fixation tomorrow.  N.p.o. after midnight.  Patient is having a left great toe pain.  Ordered x-rays of his left foot.  Okay to mobilize nonweightbearing on the right lower extremity.  Shona Needles, MD [facility] [phone] (phone) [phone] (office) [email or web address]
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
## 2018-10-02 NOTE — Progress Notes (Signed)
Patient ID: Derek Ford, male   DOB: 1965-06-21, 53 y.o.   MRN: [id number] Overall patient did okay condition of sternal pain low back pain stable no radicular symptoms  Neurologically stable  Awaiting brace when brace arrives with get an upright x-ray

## 2018-10-03 ENCOUNTER — Encounter (HOSPITAL_COMMUNITY): Admission: EM | Disposition: A | Discharge status: Home / Self Care | Payer: Self-pay | Source: Home / Self Care

## 2018-10-03 ENCOUNTER — Encounter (HOSPITAL_COMMUNITY): Payer: Self-pay | Admitting: *Deleted

## 2018-10-03 ENCOUNTER — Inpatient Hospital Stay (HOSPITAL_COMMUNITY): Admitting: Registered Nurse

## 2018-10-03 ENCOUNTER — Inpatient Hospital Stay (HOSPITAL_COMMUNITY)

## 2018-10-03 HISTORY — PX: ORIF ANKLE FRACTURE: SHX5408

## 2018-10-03 SURGERY — OPEN REDUCTION INTERNAL FIXATION (ORIF) ANKLE FRACTURE
Anesthesia: General | Site: Ankle | Laterality: Right

## 2018-10-03 MED ORDER — BUPIVACAINE-EPINEPHRINE (PF) 0.5% -1:200000 IJ SOLN
INTRAMUSCULAR | Status: DC | PRN
  Administered 2018-10-03: 30 mL via PERINEURAL
  Administered 2018-10-03: 10 mL via PERINEURAL

## 2018-10-03 MED ORDER — PROMETHAZINE HCL 25 MG/ML IJ SOLN: 6.2500 mg | INTRAMUSCULAR | Status: DC | PRN

## 2018-10-03 MED ORDER — ROCURONIUM BROMIDE 10 MG/ML (PF) SYRINGE
PREFILLED_SYRINGE | INTRAVENOUS | Status: DC | PRN
  Administered 2018-10-03: 50 mg via INTRAVENOUS

## 2018-10-03 MED ORDER — PHENYLEPHRINE 40 MCG/ML (10ML) SYRINGE FOR IV PUSH (FOR BLOOD PRESSURE SUPPORT)
PREFILLED_SYRINGE | INTRAVENOUS | Status: DC | PRN
  Administered 2018-10-03: 80 ug via INTRAVENOUS
  Administered 2018-10-03: 120 ug via INTRAVENOUS

## 2018-10-03 MED ORDER — CEFAZOLIN SODIUM-DEXTROSE 2-4 GM/100ML-% IV SOLN
2.0000 g | Freq: Three times a day (TID) | INTRAVENOUS | Status: AC
  Administered 2018-10-03 – 2018-10-04 (×3): 2 g via INTRAVENOUS
  Filled 2018-10-03 (×3): qty 100

## 2018-10-03 MED ORDER — BACITRACIN ZINC 500 UNIT/GM EX OINT
TOPICAL_OINTMENT | CUTANEOUS | Status: AC
  Filled 2018-10-03: qty 28.35

## 2018-10-03 MED ORDER — CEFAZOLIN SODIUM-DEXTROSE 2-3 GM-%(50ML) IV SOLR
INTRAVENOUS | Status: DC | PRN
  Administered 2018-10-03: 2 g via INTRAVENOUS

## 2018-10-03 MED ORDER — ONDANSETRON HCL 4 MG/2ML IJ SOLN
INTRAMUSCULAR | Status: AC
  Filled 2018-10-03: qty 2

## 2018-10-03 MED ORDER — MIDAZOLAM HCL 2 MG/2ML IJ SOLN
INTRAMUSCULAR | Status: AC
  Filled 2018-10-03: qty 2

## 2018-10-03 MED ORDER — FENTANYL CITRATE (PF) 250 MCG/5ML IJ SOLN
INTRAMUSCULAR | Status: DC | PRN
  Administered 2018-10-03 (×2): 50 ug via INTRAVENOUS

## 2018-10-03 MED ORDER — ONDANSETRON HCL 4 MG/2ML IJ SOLN
INTRAMUSCULAR | Status: DC | PRN
  Administered 2018-10-03: 4 mg via INTRAVENOUS

## 2018-10-03 MED ORDER — MIDAZOLAM HCL 2 MG/2ML IJ SOLN
INTRAMUSCULAR | Status: AC
  Administered 2018-10-03: 2 mg via INTRAVENOUS
  Filled 2018-10-03: qty 2

## 2018-10-03 MED ORDER — PROPOFOL 10 MG/ML IV BOLUS
INTRAVENOUS | Status: AC
  Filled 2018-10-03: qty 20

## 2018-10-03 MED ORDER — LACTATED RINGERS IV SOLN
INTRAVENOUS | Status: DC | PRN
  Administered 2018-10-03 (×2): via INTRAVENOUS

## 2018-10-03 MED ORDER — ARTIFICIAL TEARS OPHTHALMIC OINT
TOPICAL_OINTMENT | OPHTHALMIC | Status: DC | PRN
  Administered 2018-10-03: 1 via OPHTHALMIC

## 2018-10-03 MED ORDER — SUGAMMADEX SODIUM 200 MG/2ML IV SOLN
INTRAVENOUS | Status: DC | PRN
  Administered 2018-10-03: 200 mg via INTRAVENOUS

## 2018-10-03 MED ORDER — SODIUM CHLORIDE 0.9 % IV SOLN
INTRAVENOUS | Status: DC | PRN
  Administered 2018-10-03: 25 ug/min via INTRAVENOUS

## 2018-10-03 MED ORDER — FENTANYL CITRATE (PF) 100 MCG/2ML IJ SOLN
INTRAMUSCULAR | Status: AC
  Administered 2018-10-03: 09:00:00 50 ug via INTRAVENOUS
  Filled 2018-10-03: qty 2

## 2018-10-03 MED ORDER — DEXAMETHASONE SODIUM PHOSPHATE 10 MG/ML IJ SOLN
INTRAMUSCULAR | Status: AC
  Filled 2018-10-03: qty 1

## 2018-10-03 MED ORDER — MIDAZOLAM HCL 2 MG/2ML IJ SOLN
2.0000 mg | Freq: Once | INTRAMUSCULAR | Status: AC
  Administered 2018-10-03: 2 mg via INTRAVENOUS

## 2018-10-03 MED ORDER — EPHEDRINE SULFATE-NACL 50-0.9 MG/10ML-% IV SOSY
PREFILLED_SYRINGE | INTRAVENOUS | Status: DC | PRN
  Administered 2018-10-03 (×2): 10 mg via INTRAVENOUS

## 2018-10-03 MED ORDER — BUPIVACAINE HCL 0.5 % IJ SOLN
INTRAMUSCULAR | Status: AC
  Filled 2018-10-03: qty 1

## 2018-10-03 MED ORDER — IPRATROPIUM-ALBUTEROL 0.5-2.5 (3) MG/3ML IN SOLN: 3.0000 mL | Freq: Four times a day (QID) | RESPIRATORY_TRACT | Status: DC | PRN

## 2018-10-03 MED ORDER — VANCOMYCIN HCL 1000 MG IV SOLR
INTRAVENOUS | Status: DC | PRN
  Administered 2018-10-03: 1000 mg

## 2018-10-03 MED ORDER — CLONIDINE HCL (ANALGESIA) 100 MCG/ML EP SOLN
EPIDURAL | Status: DC | PRN
  Administered 2018-10-03: 100 ug

## 2018-10-03 MED ORDER — DEXMEDETOMIDINE HCL 200 MCG/2ML IV SOLN
INTRAVENOUS | Status: DC | PRN
  Administered 2018-10-03: 12 ug via INTRAVENOUS
  Administered 2018-10-03: 8 ug via INTRAVENOUS

## 2018-10-03 MED ORDER — VANCOMYCIN HCL 1000 MG IV SOLR
INTRAVENOUS | Status: AC
  Filled 2018-10-03: qty 1000

## 2018-10-03 MED ORDER — LIDOCAINE 2% (20 MG/ML) 5 ML SYRINGE
INTRAMUSCULAR | Status: DC | PRN
  Administered 2018-10-03: 100 mg via INTRAVENOUS

## 2018-10-03 MED ORDER — 0.9 % SODIUM CHLORIDE (POUR BTL) OPTIME
TOPICAL | Status: DC | PRN
  Administered 2018-10-03: 1000 mL

## 2018-10-03 MED ORDER — FENTANYL CITRATE (PF) 100 MCG/2ML IJ SOLN: 25.0000 ug | INTRAMUSCULAR | Status: DC | PRN

## 2018-10-03 MED ORDER — ENOXAPARIN SODIUM 40 MG/0.4ML ~~LOC~~ SOLN
40.0000 mg | SUBCUTANEOUS | Status: DC
  Administered 2018-10-04 – 2018-10-06 (×3): 40 mg via SUBCUTANEOUS
  Filled 2018-10-03 (×4): qty 0.4

## 2018-10-03 MED ORDER — PROPOFOL 10 MG/ML IV BOLUS
INTRAVENOUS | Status: DC | PRN
  Administered 2018-10-03: 150 mg via INTRAVENOUS

## 2018-10-03 MED ORDER — FENTANYL CITRATE (PF) 100 MCG/2ML IJ SOLN
50.0000 ug | Freq: Once | INTRAMUSCULAR | Status: AC
  Administered 2018-10-03: 09:00:00 50 ug via INTRAVENOUS

## 2018-10-03 MED ORDER — DEXAMETHASONE SODIUM PHOSPHATE 10 MG/ML IJ SOLN
INTRAMUSCULAR | Status: DC | PRN
  Administered 2018-10-03: 10 mg via INTRAVENOUS

## 2018-10-03 MED ORDER — FENTANYL CITRATE (PF) 250 MCG/5ML IJ SOLN
INTRAMUSCULAR | Status: AC
  Filled 2018-10-03: qty 5

## 2018-10-03 MED ORDER — LACTATED RINGERS IV SOLN
INTRAVENOUS | Status: DC
  Administered 2018-10-03 – 2018-10-04 (×4): via INTRAVENOUS

## 2018-10-03 MED ORDER — GABAPENTIN 600 MG PO TABS
300.0000 mg | ORAL_TABLET | Freq: Three times a day (TID) | ORAL | Status: DC
  Administered 2018-10-03 – 2018-10-06 (×9): 300 mg via ORAL
  Filled 2018-10-03 (×9): qty 1

## 2018-10-03 SURGICAL SUPPLY — 66 items
BANDAGE ESMARK 6X9 LF (GAUZE/BANDAGES/DRESSINGS) ×1 IMPLANT
BENZOIN TINCTURE PRP APPL 2/3 (GAUZE/BANDAGES/DRESSINGS) ×4 IMPLANT
BIT DRILL CANN 2.7X625 NONSTRL (BIT) ×2 IMPLANT
BNDG COHESIVE 4X5 TAN STRL (GAUZE/BANDAGES/DRESSINGS) ×3 IMPLANT
BNDG ELASTIC 4X5.8 VLCR STR LF (GAUZE/BANDAGES/DRESSINGS) ×4 IMPLANT
BNDG ELASTIC 6X5.8 VLCR STR LF (GAUZE/BANDAGES/DRESSINGS) IMPLANT
BNDG ESMARK 6X9 LF (GAUZE/BANDAGES/DRESSINGS) ×3
BRUSH SCRUB EZ PLAIN DRY (MISCELLANEOUS) ×4 IMPLANT
CHLORAPREP W/TINT 26 (MISCELLANEOUS) ×3 IMPLANT
CLOSURE WOUND 1/2 X4 (GAUZE/BANDAGES/DRESSINGS) ×2
COVER SURGICAL LIGHT HANDLE (MISCELLANEOUS) ×3 IMPLANT
COVER WAND RF STERILE (DRAPES) ×3 IMPLANT
DRAPE C-ARM 42X72 X-RAY (DRAPES) ×3 IMPLANT
DRAPE C-ARMOR (DRAPES) ×3 IMPLANT
DRAPE ORTHO SPLIT 77X108 STRL (DRAPES) ×4
DRAPE SURG ORHT 6 SPLT 77X108 (DRAPES) ×2 IMPLANT
DRAPE U-SHAPE 47X51 STRL (DRAPES) ×3 IMPLANT
DRSG ADAPTIC 3X8 NADH LF (GAUZE/BANDAGES/DRESSINGS) IMPLANT
ELECT REM PT RETURN 9FT ADLT (ELECTROSURGICAL) ×3
ELECTRODE REM PT RTRN 9FT ADLT (ELECTROSURGICAL) ×1 IMPLANT
GAUZE SPONGE 4X4 12PLY STRL (GAUZE/BANDAGES/DRESSINGS) IMPLANT
GAUZE SPONGE 4X4 12PLY STRL LF (GAUZE/BANDAGES/DRESSINGS) ×4 IMPLANT
GLOVE BIO SURGEON STRL SZ 6.5 (GLOVE) ×6 IMPLANT
GLOVE BIO SURGEON STRL SZ7.5 (GLOVE) ×9 IMPLANT
GLOVE BIO SURGEONS STRL SZ 6.5 (GLOVE) ×3
GLOVE BIOGEL PI IND STRL 6.5 (GLOVE) ×1 IMPLANT
GLOVE BIOGEL PI IND STRL 7.5 (GLOVE) ×1 IMPLANT
GLOVE BIOGEL PI INDICATOR 6.5 (GLOVE) ×2
GLOVE BIOGEL PI INDICATOR 7.5 (GLOVE) ×2
GOWN STRL REUS W/ TWL LRG LVL3 (GOWN DISPOSABLE) ×2 IMPLANT
GOWN STRL REUS W/TWL LRG LVL3 (GOWN DISPOSABLE) ×4
GUIDEWARE NON THREAD 1.25X150 (WIRE) ×3
GUIDEWIRE NON THREAD 1.25X150 (WIRE) IMPLANT
KIT TURNOVER KIT B (KITS) ×3 IMPLANT
MANIFOLD NEPTUNE II (INSTRUMENTS) ×3 IMPLANT
NDL HYPO 21X1.5 SAFETY (NEEDLE) IMPLANT
NDL HYPO 25GX1X1/2 BEV (NEEDLE) ×1 IMPLANT
NEEDLE HYPO 21X1.5 SAFETY (NEEDLE) IMPLANT
NEEDLE HYPO 25GX1X1/2 BEV (NEEDLE) ×3 IMPLANT
NS IRRIG 1000ML POUR BTL (IV SOLUTION) ×3 IMPLANT
PACK TOTAL JOINT (CUSTOM PROCEDURE TRAY) ×3 IMPLANT
PAD ARMBOARD 7.5X6 YLW CONV (MISCELLANEOUS) ×6 IMPLANT
PAD CAST 4YDX4 CTTN HI CHSV (CAST SUPPLIES) IMPLANT
PADDING CAST COTTON 4X4 STRL (CAST SUPPLIES) ×4
PADDING CAST COTTON 6X4 STRL (CAST SUPPLIES) IMPLANT
PLATE LCP 3.5 1/3 TUB 5HX57 (Plate) ×2 IMPLANT
SCREW CORTEX 3.5 45MM (Screw) ×2 IMPLANT
SCREW CORTEX 3.5 50MM (Screw) ×2 IMPLANT
SCREW SHORT THREAD 4.0X40 (Screw) ×2 IMPLANT
SPONGE LAP 18X18 RF (DISPOSABLE) IMPLANT
STAPLER VISISTAT 35W (STAPLE) ×3 IMPLANT
STRIP CLOSURE SKIN 1/2X4 (GAUZE/BANDAGES/DRESSINGS) ×2 IMPLANT
SUCTION FRAZIER HANDLE 10FR (MISCELLANEOUS) ×2
SUCTION TUBE FRAZIER 10FR DISP (MISCELLANEOUS) ×1 IMPLANT
SUT ETHILON 3 0 PS 1 (SUTURE) ×4 IMPLANT
SUT MNCRL AB 3-0 PS2 18 (SUTURE) ×2 IMPLANT
SUT PROLENE 0 CT (SUTURE) IMPLANT
SUT VIC AB 0 CT1 27 (SUTURE) ×2
SUT VIC AB 0 CT1 27XBRD ANBCTR (SUTURE) ×1 IMPLANT
SUT VIC AB 2-0 CT1 27 (SUTURE) ×2
SUT VIC AB 2-0 CT1 TAPERPNT 27 (SUTURE) ×2 IMPLANT
SYR CONTROL 10ML LL (SYRINGE) ×3 IMPLANT
TOWEL GREEN STERILE (TOWEL DISPOSABLE) ×6 IMPLANT
TOWEL GREEN STERILE FF (TOWEL DISPOSABLE) ×3 IMPLANT
UNDERPAD 30X30 (UNDERPADS AND DIAPERS) ×3 IMPLANT
WATER STERILE IRR 1000ML POUR (IV SOLUTION) ×3 IMPLANT

## 2018-10-03 NOTE — Progress Notes (Signed)
Pt able to place self on home CPAP. RT gave pt bottle of sterile H2O for his machine and asked if he needed any further assistance. Pt states he is fine and can handle it from here. Pt respiratory status stable at this time. RT will continue to monitor.
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
## 2018-10-03 NOTE — Progress Notes (Signed)
Patient assisted with his home cpap placed within reaching distance. Patient's belongings noted on top of table such as driving license, paperwork, and charger cord. Patient says to place these items in the bag on his table. This nurse double bagged patient's belongings and reminded patient of his paperwork, driver's license and charger port located in bag. Patient thanked this nurse.

## 2018-10-03 NOTE — Progress Notes (Addendum)
Preparing check list for surgery. Spo2 noted to be in the 80;s overnight. Probe changed and this trend continued. O2/ 2L/ placed. Other vital signs stable. NPO after midnight. Pre-op notified and report given. Tylenol 1000 mg orally given 0800 with sip of water. IV right AC. Pt voided. No dentures, or hearing aids, glasses and cell phone present. Simmie Davies RN

## 2018-10-03 NOTE — Progress Notes (Signed)
PT Cancellation Note  Patient Details Name: Derek Ford MRN: [id number] DOB: [birth year]   Cancelled Treatment:    Reason Eval/Treat Not Completed: Other (comment). Per chart, pt preparing for sx this AM. Will follow-up for PT evaluation post-op as schedule permits.  Mabeline Caras, PT, DPT Acute Rehabilitation Services  Pager [phone] Office [place] 10/03/2018, 7:54 AM

## 2018-10-03 NOTE — Progress Notes (Signed)
Patients wife's number was incorrectly listed in chart. Her number is [phone] for updates for information. I updated her number under emergency contacts.

## 2018-10-03 NOTE — Anesthesia Procedure Notes (Signed)
Anesthesia Regional Block: Adductor canal block   Pre-Anesthetic Checklist: ,, timeout performed, Correct Patient, Correct Site, Correct Laterality, Correct Procedure, Correct Position, site marked, Risks and benefits discussed,  Surgical consent,  Pre-op evaluation,  At surgeon's request and post-op pain management  Laterality: Right  Prep: chloraprep       Needles:  Injection technique: Single-shot  Needle Type: Echogenic Needle     Needle Length: 9cm  Needle Gauge: 21     Additional Needles:   Procedures:,,,, ultrasound used (permanent image in chart),,,,  Narrative:  Start time: 10/03/2018 9:00 AM End time: 10/03/2018 9:10 AM Injection made incrementally with aspirations every 5 mL.  Performed by: Personally  Anesthesiologist: Catalina Gravel, MD  Additional Notes: No pain on injection. No increased resistance to injection. Injection made in 5cc increments.  Good needle visualization.  Patient tolerated procedure well.

## 2018-10-03 NOTE — Transfer of Care (Signed)
Immediate Anesthesia Transfer of Care Note  Patient: Derek Ford  Procedure(s) Performed: OPEN REDUCTION INTERNAL FIXATION (ORIF) ANKLE FRACTURE (Right Ankle)  Patient Location: PACU  Anesthesia Type:General and Regional  Level of Consciousness: drowsy, patient cooperative and responds to stimulation  Airway & Oxygen Therapy: Patient Spontanous Breathing and Patient connected to face mask oxygen  Post-op Assessment: Report given to RN and Post -op Vital signs reviewed and stable  Post vital signs: Reviewed and stable  Last Vitals:  Vitals Value Taken Time  BP 122/91 10/03/18 1126  Temp    Pulse 99 10/03/18 1126  Resp 13 10/03/18 1126  SpO2 97 % 10/03/18 1126  Vitals shown include unvalidated device data.  Last Pain:  Vitals:   10/03/18 0920  TempSrc:   PainSc: 4       Patients Stated Pain Goal: 2 (75/10/25 8527)  Complications: No apparent anesthesia complications

## 2018-10-03 NOTE — Anesthesia Procedure Notes (Signed)
Anesthesia Regional Block: Popliteal block   Pre-Anesthetic Checklist: ,, timeout performed, Correct Patient, Correct Site, Correct Laterality, Correct Procedure, Correct Position, site marked, Risks and benefits discussed,  Surgical consent,  Pre-op evaluation,  At surgeon's request and post-op pain management  Laterality: Right  Prep: chloraprep       Needles:  Injection technique: Single-shot  Needle Type: Echogenic Needle     Needle Length: 9cm  Needle Gauge: 21     Additional Needles:   Procedures:,,,, ultrasound used (permanent image in chart),,,,  Narrative:  Start time: 10/03/2018 8:50 AM End time: 10/03/2018 9:00 AM Injection made incrementally with aspirations every 5 mL.  Performed by: Personally  Anesthesiologist: Catalina Gravel, MD  Additional Notes: No pain on injection. No increased resistance to injection. Injection made in 5cc increments.  Good needle visualization.  Patient tolerated procedure well.

## 2018-10-03 NOTE — Evaluation (Signed)
OT Cancellation Note  Patient Details Name: Derek Ford MRN: [id number] DOB: 09/07/1965   Cancelled Treatment:    Reason Eval/Treat Not Completed: Patient at procedure or test/ unavailable OT order received. Per chart review, pt with planned sx this AM. Will continue to follow as pt available and appropriate to initiate OT POC.  Zenovia Jarred, MSOT, OTR/L Behavioral Health OT/ Acute Relief OT [facility] Office: [phone]  Zenovia Jarred 10/03/2018, 8:39 AM
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
## 2018-10-03 NOTE — Progress Notes (Signed)
Orthopedic Tech Progress Note Patient Details:  Derek Ford [birth year] [id number]  Ortho Devices Type of Ortho Device: CAM walker, Postop shoe/boot Ortho Device/Splint Location: right camwalker and left post op shoe Ortho Device/Splint Interventions: Application   Post Interventions Patient Tolerated: Well Instructions Provided: Care of device   [name] 10/03/2018, 3:30 PM

## 2018-10-03 NOTE — Anesthesia Postprocedure Evaluation (Signed)
Anesthesia Post Note  Patient: Derek Ford  Procedure(s) Performed: OPEN REDUCTION INTERNAL FIXATION (ORIF) ANKLE FRACTURE (Right Ankle)     Patient location during evaluation: PACU Anesthesia Type: General Level of consciousness: awake and alert, awake and oriented Pain management: pain level controlled Vital Signs Assessment: post-procedure vital signs reviewed and stable Respiratory status: spontaneous breathing, nonlabored ventilation and respiratory function stable Cardiovascular status: blood pressure returned to baseline and stable Postop Assessment: no apparent nausea or vomiting Anesthetic complications: no    Last Vitals:  Vitals:   10/03/18 1240 10/03/18 1241  BP:  114/83  Pulse: 97   Resp: 16   Temp:    SpO2: 96%     Last Pain:  Vitals:   10/03/18 1230  TempSrc:   PainSc: 0-No pain                 Catalina Gravel

## 2018-10-03 NOTE — Anesthesia Procedure Notes (Signed)
Procedure Name: Intubation Date/Time: 10/03/2018 9:49 AM Performed by: Jearld Pies, CRNA Pre-anesthesia Checklist: Patient identified, Emergency Drugs available, Suction available and Patient being monitored Patient Re-evaluated:Patient Re-evaluated prior to induction Oxygen Delivery Method: Circle System Utilized Preoxygenation: Pre-oxygenation with 100% oxygen Induction Type: IV induction Ventilation: Mask ventilation without difficulty and Oral airway inserted - appropriate to patient size Laryngoscope Size: Mac and 4 Grade View: Grade I Tube type: Oral Tube size: 7.5 mm Number of attempts: 1 Airway Equipment and Method: Stylet and Oral airway Placement Confirmation: ETT inserted through vocal cords under direct vision,  positive ETCO2 and breath sounds checked- equal and bilateral Secured at: 23 cm Tube secured with: Tape Dental Injury: Teeth and Oropharynx as per pre-operative assessment

## 2018-10-03 NOTE — Anesthesia Preprocedure Evaluation (Addendum)
Anesthesia Evaluation  Patient identified by MRN, date of birth, ID band Patient awake    Reviewed: Allergy & Precautions, NPO status , Patient's Chart, lab work & pertinent test results  Airway Mallampati: II  TM Distance: >3 FB Neck ROM: Full    Dental  (+) Teeth Intact, Dental Advisory Given   Pulmonary neg pulmonary ROS,    Pulmonary exam normal breath sounds clear to auscultation       Cardiovascular negative cardio ROS Normal cardiovascular exam Rhythm:Regular Rate:Normal     Neuro/Psych PSYCHIATRIC DISORDERS Depression negative neurological ROS     GI/Hepatic negative GI ROS, Neg liver ROS,   Endo/Other  negative endocrine ROS  Renal/GU negative Renal ROS     Musculoskeletal negative musculoskeletal ROS (+)   Abdominal   Peds  Hematology  (+) Blood dyscrasia, anemia ,   Anesthesia Other Findings Day of surgery medications reviewed with the patient.  Reproductive/Obstetrics                             Anesthesia Physical Anesthesia Plan  ASA: II  Anesthesia Plan: General   Post-op Pain Management:  Regional for Post-op pain   Induction: Intravenous  PONV Risk Score and Plan: 2 and Midazolam, Dexamethasone and Ondansetron  Airway Management Planned: LMA  Additional Equipment:   Intra-op Plan:   Post-operative Plan: Extubation in OR  Informed Consent: I have reviewed the patients History and Physical, chart, labs and discussed the procedure including the risks, benefits and alternatives for the proposed anesthesia with the patient or authorized representative who has indicated his/her understanding and acceptance.     Dental advisory given  Plan Discussed with: CRNA  Anesthesia Plan Comments:         Anesthesia Quick Evaluation

## 2018-10-03 NOTE — Interval H&P Note (Signed)
History and Physical Interval Note:  10/03/2018 9:05 AM  Derek Ford  has presented today for surgery, with the diagnosis of Right ankle fracture.  The various methods of treatment have been discussed with the patient and family. After consideration of risks, benefits and other options for treatment, the patient has consented to  Procedure(s): OPEN REDUCTION INTERNAL FIXATION (ORIF) ANKLE FRACTURE (Right) as a surgical intervention.  The patient's history has been reviewed, patient examined, no change in status, stable for surgery.  I have reviewed the patient's chart and labs.  Questions were answered to the patient's satisfaction.     Lennette Bihari P Haddix

## 2018-10-04 ENCOUNTER — Encounter (HOSPITAL_COMMUNITY): Payer: Self-pay | Admitting: Student

## 2018-10-04 DIAGNOSIS — S82391A Other fracture of lower end of right tibia, initial encounter for closed fracture: Secondary | ICD-10-CM

## 2018-10-04 DIAGNOSIS — S82401A Unspecified fracture of shaft of right fibula, initial encounter for closed fracture: Secondary | ICD-10-CM

## 2018-10-04 DIAGNOSIS — S92412A Displaced fracture of proximal phalanx of left great toe, initial encounter for closed fracture: Secondary | ICD-10-CM

## 2018-10-04 LAB — CBC
HCT: 33 % — ABNORMAL LOW (ref 39.0–52.0)
Hemoglobin: 11 g/dL — ABNORMAL LOW (ref 13.0–17.0)
MCH: 30.7 pg (ref 26.0–34.0)
MCHC: 33.3 g/dL (ref 30.0–36.0)
MCV: 92.2 fL (ref 80.0–100.0)
Platelets: 175 10*3/uL (ref 150–400)
RBC: 3.58 MIL/uL — ABNORMAL LOW (ref 4.22–5.81)
RDW: 12.5 % (ref 11.5–15.5)
WBC: 12 10*3/uL — ABNORMAL HIGH (ref 4.0–10.5)
nRBC: 0 % (ref 0.0–0.2)
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
## 2018-10-04 NOTE — Progress Notes (Signed)
Pt can place self on home machine without assistance. Pt states he is fine at this time and will call if he needs assistance. RT will continue to monitor.
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
## 2018-10-05 NOTE — Progress Notes (Signed)
Pt able to apply mask himself without difficulty. Pt is on his home CPAP and not in need of RT help at this time. Pt respiratory status stable at this time. RT will continue to monitor.
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
## 2018-10-05 NOTE — Progress Notes (Signed)
Subjective: 2 Days Post-Op Procedure(s) (LRB): OPEN REDUCTION INTERNAL FIXATION (ORIF) ANKLE FRACTURE (Right) Patient reports pain as controlled  Objective: Vital signs in last 24 hours: Temp:  [97.9 F (36.6 C)-98.5 F (36.9 C)] 97.9 F (36.6 C) (08/01 1225) Pulse Rate:  [43-88] 79 (08/01 1225) Resp:  [9-22] 16 (08/01 1225) BP: (108-130)/(65-91) 116/69 (08/01 1225) SpO2:  [90 %-98 %] 97 % (08/01 1225)  Intake/Output from previous day: 07/31 0701 - 08/01 0700 In: 240 [P.O.:240] Out: 3650 [Urine:3650] Intake/Output this shift: No intake/output data recorded.  Recent Labs    10/04/18 0653  HGB 11.0*   Recent Labs    10/04/18 0653  WBC 12.0*  RBC 3.58*  HCT 33.0*  PLT 175   No results for input(s): NA, K, CL, CO2, BUN, CREATININE, GLUCOSE, CALCIUM in the last 72 hours. No results for input(s): LABPT, INR in the last 72 hours.  ABD soft Sensation intact distally Intact pulses distally Incision: no drainage   Assessment/Plan: 2 Days Post-Op Procedure(s) (LRB): OPEN REDUCTION INTERNAL FIXATION (ORIF) ANKLE FRACTURE (Right) Up with therapy  OK with discharge from orthopedic standpoint.   Derek Ford J Kerriann Kamphuis 10/05/2018, 2:41 PM
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
## 2018-10-05 NOTE — Discharge Instructions (Signed)
How to Use a Back Brace  A back brace is a form-fitting device that wraps around your trunk to support your lower back, abdomen, and hips. You may need to wear a back brace to relieve back pain or to correct a medical condition related to the back, such as abnormal curvature of the spine (scoliosis). A back brace can maintain or correct the shape of the spine and prevent a spinal problem from getting worse. A back brace can also take pressure off the layers of tissue (disks) between the bones of the spine (vertebrae). You may need a back brace to keep your back and spine in place while you heal from an injury or recover from surgery. Back braces can be either plastic (rigid brace) or soft elastic (dynamic brace).  A rigid brace usually covers both the front and back of the entire upper body.  A soft brace may cover only the lower back and abdomen and may fasten with self-adhesive elastic straps. Your health care provider will recommend the proper brace for your needs and medical condition. What are the risks?  A back brace may not help if you do not wear it as directed by your health care provider. Be sure to wear the brace exactly as instructed in order to prevent further back problems.  Wearing the brace may be uncomfortable at first. You may have trouble sleeping with it on. It may also be hard for you to do certain activities while wearing it.  If the back brace is not worn as instructed, it may cause rubbing and pressure on your skin and may cause sores. How to use a back brace Different types of braces will have different instructions for use. Follow instructions from your health care provider about:  How to put on the brace.  When and how often to wear the brace. In some cases, braces may need to be worn for long stretches of time. For example, a brace may need to be worn for 16-23 hours a day when used for scoliosis.  How to take off the brace.  Any safety tips you should follow when  wearing the brace. This may include: ? Move carefully while wearing the brace. The brace restricts your movement and could lead to additional injuries. ? Use a cane or walker for support if you feel unsteady. ? Sit in high, firm chairs. It may be difficult to stand up from low, soft chairs. ? Check the skin around the brace every day. Tell your health care provider about any concerns. How to care for a back brace  Do not let the back brace get wet. Typically, you will remove the brace for bathing and then put it back on afterward.  If you have a rigid brace, be sure to store it in a safe place when you are not wearing it. This will help to prevent damage.  Clean or wash the back brace with mild soap and water as told by your health care provider. Contact a health care provider if:  Your brace gets damaged.  You have pain or discomfort when wearing the back brace.  Your back pain is getting worse or is not improving over time.  You notice redness or skin breakdown from wearing the brace. Summary  You may need to wear a back brace to relieve back pain or to correct a medical condition related to the back, such as abnormal curvature of the spine (scoliosis).  Follow instructions as told by your health  care provider about how to use the brace and how to take care of it.  A back brace may not help if you do not wear it as directed by your health care provider. Wear the brace exactly as instructed in order to prevent further back problems.  Move carefully while wearing the brace.  Contact a health care provider if you have pain or discomfort when wearing the back brace or your back pain is getting worse or is not improving over time. This information is not intended to replace advice given to you by your health care provider. Make sure you discuss any questions you have with your health care provider. Document Released: 02/09/2011 Document Revised: 01/16/2018 Document Reviewed:  01/16/2018 Elsevier Patient Education  [employer].  No weight bearing on your right leg till OK with Dr. Doreatha Martin.

## 2018-10-05 NOTE — TOC Transition Note (Signed)
Transition of Care [facility]) - CM/SW Discharge Note   Patient Details  Name: Derek Ford MRN: [id number] Date of Birth: February 05, 1966  [facility] CM/SW Contact:  Claudie Leach, RN Phone Number: 10/05/2018, 4:20 PM   Clinical Narrative:    Pt to d/c home with home health.  Referral accepted by Tommi Rumps at [facility].  RW and 3n1 to be delivered to room prior to d/c.    Final next level of care: [place] Barriers to Discharge: No Barriers Identified   Patient Goals and CMS Choice Patient states their goals for this hospitalization and ongoing recovery are:: to get home CMS Medicare.gov Compare Post Acute Care list provided to:: Patient Choice offered to / list presented to : Patient   Discharge Plan and Services                DME Arranged: 3-N-1, Walker rolling DME Agency: [employer] Date DME Agency Contacted: 10/05/18 Time DME Agency Contacted: [phone] Representative spoke with at DME Agency: [place]: PT, OT [place] Agency: [place] Date [place]: 10/05/18 Time [place]: [phone] Representative spoke with at [place]: Tommi Rumps
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
## 2018-10-06 ENCOUNTER — Other Ambulatory Visit: Payer: Self-pay

## 2018-10-06 ENCOUNTER — Encounter (HOSPITAL_COMMUNITY): Payer: Self-pay

## 2018-10-06 MED ORDER — OXYCODONE HCL 5 MG PO TABS: 5.0000 mg | ORAL_TABLET | ORAL | 0 refills | Status: DC | PRN

## 2018-10-06 MED ORDER — METHOCARBAMOL 500 MG PO TABS: 500.0000 mg | ORAL_TABLET | Freq: Four times a day (QID) | ORAL | 0 refills | Status: AC | PRN

## 2018-10-06 MED ORDER — GABAPENTIN 600 MG PO TABS: 300.0000 mg | ORAL_TABLET | Freq: Three times a day (TID) | ORAL | 0 refills | Status: DC

## 2018-10-06 MED ORDER — IBUPROFEN 200 MG PO TABS: ORAL_TABLET | ORAL | 0 refills | Status: DC

## 2018-10-06 MED ORDER — ACETAMINOPHEN 500 MG PO TABS: 1000.0000 mg | ORAL_TABLET | Freq: Four times a day (QID) | ORAL | 0 refills | Status: DC

## 2018-10-06 MED ORDER — DOCUSATE SODIUM 100 MG PO CAPS: 100.0000 mg | ORAL_CAPSULE | Freq: Two times a day (BID) | ORAL | 0 refills | Status: DC | PRN

## 2018-10-06 NOTE — Progress Notes (Signed)
Subjective: 3 Days Post-Op Procedure(s) (LRB): OPEN REDUCTION INTERNAL FIXATION (ORIF) ANKLE FRACTURE (Right) Patient reports pain as under control.  Wants to be discharged today  Objective: Vital signs in last 24 hours: Temp:  [97.9 F (36.6 C)-98.7 F (37.1 C)] 98.5 F (36.9 C) (08/02 0830) Pulse Rate:  [64-91] 84 (08/02 0830) Resp:  [15-20] 20 (08/02 0830) BP: (111-138)/(69-90) 130/81 (08/02 0830) SpO2:  [96 %-99 %] 98 % (08/02 0830)  Intake/Output from previous day: 08/01 0701 - 08/02 0700 In: 1050 [P.O.:1050] Out: 3650 [Urine:3650] Intake/Output this shift: No intake/output data recorded.  Recent Labs    10/04/18 0653  HGB 11.0*   Recent Labs    10/04/18 0653  WBC 12.0*  RBC 3.58*  HCT 33.0*  PLT 175   No results for input(s): NA, K, CL, CO2, BUN, CREATININE, GLUCOSE, CALCIUM in the last 72 hours. No results for input(s): LABPT, INR in the last 72 hours.  Neurologically intact ABD soft Sensation intact distally Intact pulses distally Incision: dressing C/D/I   Assessment/Plan: 3 Days Post-Op Procedure(s) (LRB): OPEN REDUCTION INTERNAL FIXATION (ORIF) ANKLE FRACTURE (Right)  OK to discharge to home today from orthopedic stand point.  Case management has set up home health and has delivered DME to his room.     Derek Ford J Ayzia Day 10/06/2018, 10:20 AM

## 2018-10-06 NOTE — Plan of Care (Signed)
  Problem: Education: Goal: Knowledge of General Education information will improve Description: Including pain rating scale, medication(s)/side effects and non-pharmacologic comfort measures 10/06/2018 1239 by Dario Guardian, RN Outcome: Adequate for Discharge 10/06/2018 0754 by Dario Guardian, RN Outcome: Progressing   Problem: Health Behavior/Discharge Planning: Goal: Ability to manage health-related needs will improve 10/06/2018 1239 by Dario Guardian, RN Outcome: Adequate for Discharge 10/06/2018 0754 by Dario Guardian, RN Outcome: Progressing   Problem: Clinical Measurements: Goal: Ability to maintain clinical measurements within normal limits will improve 10/06/2018 1239 by Dario Guardian, RN Outcome: Adequate for Discharge 10/06/2018 0754 by Dario Guardian, RN Outcome: Progressing Goal: Will remain free from infection 10/06/2018 1239 by Dario Guardian, RN Outcome: Adequate for Discharge 10/06/2018 0754 by Dario Guardian, RN Outcome: Progressing Goal: Diagnostic test results will improve 10/06/2018 1239 by Dario Guardian, RN Outcome: Adequate for Discharge 10/06/2018 0754 by Dario Guardian, RN Outcome: Progressing Goal: Respiratory complications will improve 10/06/2018 1239 by Dario Guardian, RN Outcome: Adequate for Discharge 10/06/2018 0754 by Dario Guardian, RN Outcome: Progressing Goal: Cardiovascular complication will be avoided 10/06/2018 1239 by Dario Guardian, RN Outcome: Adequate for Discharge 10/06/2018 0754 by Dario Guardian, RN Outcome: Progressing   Problem: Activity: Goal: Risk for activity intolerance will decrease 10/06/2018 1239 by Dario Guardian, RN Outcome: Adequate for Discharge 10/06/2018 0754 by Dario Guardian, RN Outcome: Progressing   Problem: Nutrition: Goal: Adequate nutrition will be maintained 10/06/2018 1239 by Dario Guardian, RN Outcome: Adequate for Discharge 10/06/2018 0754 by Dario Guardian, RN Outcome: Progressing   Problem: Coping: Goal: Level of anxiety will decrease 10/06/2018 1239 by Dario Guardian,  RN Outcome: Adequate for Discharge 10/06/2018 0754 by Dario Guardian, RN Outcome: Progressing   Problem: Elimination: Goal: Will not experience complications related to bowel motility 10/06/2018 1239 by Dario Guardian, RN Outcome: Adequate for Discharge 10/06/2018 0754 by Dario Guardian, RN Outcome: Progressing Goal: Will not experience complications related to urinary retention 10/06/2018 1239 by Dario Guardian, RN Outcome: Adequate for Discharge 10/06/2018 0754 by Dario Guardian, RN Outcome: Progressing   Problem: Pain Managment: Goal: General experience of comfort will improve 10/06/2018 1239 by Dario Guardian, RN Outcome: Adequate for Discharge 10/06/2018 0754 by Dario Guardian, RN Outcome: Progressing   Problem: Safety: Goal: Ability to remain free from injury will improve 10/06/2018 1239 by Dario Guardian, RN Outcome: Adequate for Discharge 10/06/2018 0754 by Dario Guardian, RN Outcome: Progressing   Problem: Skin Integrity: Goal: Risk for impaired skin integrity will decrease 10/06/2018 1239 by Dario Guardian, RN Outcome: Adequate for Discharge 10/06/2018 0754 by Dario Guardian, RN Outcome: Progressing

## 2018-10-06 NOTE — Plan of Care (Signed)

## 2018-10-06 NOTE — Progress Notes (Signed)
CSW completed SBIRT with patient. Patient does not require any interventions at this time.  Madilyn Fireman, MSW, LCSW-A Clinical Social Worker [facility] [phone]
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
## 2018-10-07 ENCOUNTER — Encounter: Payer: Self-pay | Admitting: Otolaryngology

## 2018-10-22 DIAGNOSIS — S32001A Stable burst fracture of unspecified lumbar vertebra, initial encounter for closed fracture: Secondary | ICD-10-CM | POA: Insufficient documentation

## 2018-11-19 ENCOUNTER — Other Ambulatory Visit (HOSPITAL_COMMUNITY): Payer: Self-pay | Admitting: Student

## 2018-11-19 ENCOUNTER — Other Ambulatory Visit: Payer: Self-pay | Admitting: Student

## 2018-11-19 DIAGNOSIS — S32051A Stable burst fracture of fifth lumbar vertebra, initial encounter for closed fracture: Secondary | ICD-10-CM

## 2018-11-29 ENCOUNTER — Other Ambulatory Visit: Payer: Self-pay

## 2018-11-29 ENCOUNTER — Ambulatory Visit
Admission: RE | Admit: 2018-11-29 | Discharge: 2018-11-29 | Disposition: A | Discharge status: Home / Self Care | Source: Ambulatory Visit | Attending: Student | Admitting: Student

## 2018-11-29 DIAGNOSIS — S32051A Stable burst fracture of fifth lumbar vertebra, initial encounter for closed fracture: Secondary | ICD-10-CM | POA: Insufficient documentation

## 2019-01-02 DIAGNOSIS — Z6826 Body mass index (BMI) 26.0-26.9, adult: Secondary | ICD-10-CM | POA: Insufficient documentation

## 2019-01-02 DIAGNOSIS — R030 Elevated blood-pressure reading, without diagnosis of hypertension: Secondary | ICD-10-CM | POA: Insufficient documentation

## 2019-01-02 DIAGNOSIS — S32051A Stable burst fracture of fifth lumbar vertebra, initial encounter for closed fracture: Secondary | ICD-10-CM | POA: Insufficient documentation

## 2019-05-05 ENCOUNTER — Ambulatory Visit: Payer: Self-pay | Admitting: Student

## 2019-05-05 DIAGNOSIS — S82201A Unspecified fracture of shaft of right tibia, initial encounter for closed fracture: Secondary | ICD-10-CM

## 2019-05-08 ENCOUNTER — Other Ambulatory Visit: Payer: Self-pay

## 2019-05-08 ENCOUNTER — Encounter (HOSPITAL_COMMUNITY): Payer: Self-pay | Admitting: Student

## 2019-05-08 ENCOUNTER — Other Ambulatory Visit
Admission: RE | Admit: 2019-05-08 | Discharge: 2019-05-08 | Disposition: A | Discharge status: Home / Self Care | Source: Ambulatory Visit | Attending: Student | Admitting: Student

## 2019-05-08 DIAGNOSIS — Z20822 Contact with and (suspected) exposure to covid-19: Secondary | ICD-10-CM | POA: Diagnosis not present

## 2019-05-08 DIAGNOSIS — Z01812 Encounter for preprocedural laboratory examination: Secondary | ICD-10-CM | POA: Insufficient documentation

## 2019-05-08 LAB — SARS CORONAVIRUS 2 (TAT 6-24 HRS): SARS Coronavirus 2: NEGATIVE

## 2019-05-08 NOTE — Progress Notes (Addendum)
Derek Ford denies shortness of breath or shortness of breath. Patient denies any s/s of Covid and does not know of anyone was has. Derek Ford was tested for Covid today and will be in quarantine will family until surgery.  I instructed patient to not eat after midnight Sunday evening. Derek Ford lives in [place] and will not be in [place] until the morning of surgery, so he will not have Pre-Surgery Ensure to drink.   I instructed patient to drink clear liquids up until 3 hours prior to surgery, 0430. I went over what clear liquids are: Carbonated Beverages, Water, Clear Tea, Black Coffee only, Juice (non-citric and without pulp), Gatorade, Plain Jell-O  only, Plain Popsicles only.  Patient said he probably not drink anything.  I instructed patient to not take Clartin D until after surgery, but may take Clartin if needed.

## 2019-05-11 NOTE — Anesthesia Preprocedure Evaluation (Addendum)
Anesthesia Evaluation  Patient identified by MRN, date of birth, ID band Patient awake    Reviewed: Allergy & Precautions, NPO status , Patient's Chart, lab work & pertinent test results  Airway Mallampati: II  TM Distance: >3 FB Neck ROM: Full    Dental no notable dental hx. (+) Teeth Intact, Dental Advisory Given   Pulmonary sleep apnea and Continuous Positive Airway Pressure Ventilation , former smoker,    Pulmonary exam normal breath sounds clear to auscultation       Cardiovascular Exercise Tolerance: Good negative cardio ROS Normal cardiovascular exam Rhythm:Regular Rate:Normal     Neuro/Psych Anxiety negative neurological ROS  negative psych ROS   GI/Hepatic Neg liver ROS,   Endo/Other  negative endocrine ROS  Renal/GU negative Renal ROS     Musculoskeletal negative musculoskeletal ROS (+)   Abdominal   Peds  Hematology negative hematology ROS (+)   Anesthesia Other Findings   Reproductive/Obstetrics                            Anesthesia Physical Anesthesia Plan  ASA: II  Anesthesia Plan: Regional   Post-op Pain Management:    Induction: Intravenous  PONV Risk Score and Plan: Ondansetron, Dexamethasone, Midazolam and Treatment may vary due to age or medical condition  Airway Management Planned: Nasal Cannula and Natural Airway  Additional Equipment: None  Intra-op Plan:   Post-operative Plan:   Informed Consent: I have reviewed the patients History and Physical, chart, labs and discussed the procedure including the risks, benefits and alternatives for the proposed anesthesia with the patient or authorized representative who has indicated his/her understanding and acceptance.     Dental advisory given  Plan Discussed with:   Anesthesia Plan Comments: (Mac w  Adductor canal, popliteal )       Anesthesia Quick Evaluation
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
## 2019-05-12 ENCOUNTER — Ambulatory Visit (HOSPITAL_COMMUNITY)

## 2019-05-12 ENCOUNTER — Other Ambulatory Visit: Payer: Self-pay

## 2019-05-12 ENCOUNTER — Ambulatory Visit (HOSPITAL_COMMUNITY)
Admission: RE | Admit: 2019-05-12 | Discharge: 2019-05-12 | Disposition: A | Discharge status: Home / Self Care | Attending: Student | Admitting: Student

## 2019-05-12 ENCOUNTER — Encounter (HOSPITAL_COMMUNITY): Payer: Self-pay | Admitting: Student

## 2019-05-12 ENCOUNTER — Ambulatory Visit (HOSPITAL_COMMUNITY): Admitting: Anesthesiology

## 2019-05-12 ENCOUNTER — Encounter (HOSPITAL_COMMUNITY)
Admission: RE | Disposition: A | Discharge status: Home / Self Care | Payer: Self-pay | Source: Home / Self Care | Attending: Student

## 2019-05-12 DIAGNOSIS — Y798 Miscellaneous orthopedic devices associated with adverse incidents, not elsewhere classified: Secondary | ICD-10-CM | POA: Diagnosis not present

## 2019-05-12 DIAGNOSIS — S82401A Unspecified fracture of shaft of right fibula, initial encounter for closed fracture: Secondary | ICD-10-CM | POA: Insufficient documentation

## 2019-05-12 DIAGNOSIS — Z87891 Personal history of nicotine dependence: Secondary | ICD-10-CM | POA: Insufficient documentation

## 2019-05-12 DIAGNOSIS — Z419 Encounter for procedure for purposes other than remedying health state, unspecified: Secondary | ICD-10-CM

## 2019-05-12 DIAGNOSIS — S82201A Unspecified fracture of shaft of right tibia, initial encounter for closed fracture: Secondary | ICD-10-CM | POA: Insufficient documentation

## 2019-05-12 DIAGNOSIS — G4730 Sleep apnea, unspecified: Secondary | ICD-10-CM | POA: Insufficient documentation

## 2019-05-12 DIAGNOSIS — T8484XA Pain due to internal orthopedic prosthetic devices, implants and grafts, initial encounter: Secondary | ICD-10-CM | POA: Insufficient documentation

## 2019-05-12 HISTORY — DX: Anxiety disorder, unspecified: F41.9

## 2019-05-12 HISTORY — PX: HARDWARE REMOVAL: SHX979

## 2019-05-12 SURGERY — REMOVAL, HARDWARE
Anesthesia: General | Laterality: Right

## 2019-05-12 MED ORDER — FENTANYL CITRATE (PF) 100 MCG/2ML IJ SOLN
INTRAMUSCULAR | Status: DC | PRN
  Administered 2019-05-12: 100 ug via INTRAVENOUS

## 2019-05-12 MED ORDER — CEFAZOLIN SODIUM-DEXTROSE 2-4 GM/100ML-% IV SOLN
2.0000 g | INTRAVENOUS | Status: AC
  Administered 2019-05-12: 2 g via INTRAVENOUS
  Filled 2019-05-12: qty 100

## 2019-05-12 MED ORDER — ONDANSETRON HCL 4 MG/2ML IJ SOLN
INTRAMUSCULAR | Status: DC | PRN
  Administered 2019-05-12: 4 mg via INTRAVENOUS

## 2019-05-12 MED ORDER — DEXAMETHASONE SODIUM PHOSPHATE 4 MG/ML IJ SOLN
INTRAMUSCULAR | Status: DC | PRN
  Administered 2019-05-12: 5 mg via INTRAVENOUS

## 2019-05-12 MED ORDER — ROPIVACAINE HCL 5 MG/ML IJ SOLN
INTRAMUSCULAR | Status: DC | PRN
  Administered 2019-05-12: 20 mL via PERINEURAL
  Administered 2019-05-12: 30 mL via PERINEURAL

## 2019-05-12 MED ORDER — MIDAZOLAM HCL 5 MG/5ML IJ SOLN
INTRAMUSCULAR | Status: DC | PRN
  Administered 2019-05-12: 2 mg via INTRAVENOUS

## 2019-05-12 MED ORDER — LACTATED RINGERS IV SOLN: INTRAVENOUS | Status: DC | PRN

## 2019-05-12 MED ORDER — CHLORHEXIDINE GLUCONATE 4 % EX LIQD: 60.0000 mL | Freq: Once | CUTANEOUS | Status: DC

## 2019-05-12 MED ORDER — ACETAMINOPHEN 500 MG PO TABS
1000.0000 mg | ORAL_TABLET | Freq: Once | ORAL | Status: AC
  Administered 2019-05-12: 1000 mg via ORAL
  Filled 2019-05-12: qty 2

## 2019-05-12 MED ORDER — CLONIDINE HCL (ANALGESIA) 100 MCG/ML EP SOLN
EPIDURAL | Status: DC | PRN
  Administered 2019-05-12 (×2): 100 ug

## 2019-05-12 MED ORDER — PROPOFOL 10 MG/ML IV BOLUS
INTRAVENOUS | Status: DC | PRN
  Administered 2019-05-12: 100 ug/kg/min via INTRAVENOUS

## 2019-05-12 MED ORDER — 0.9 % SODIUM CHLORIDE (POUR BTL) OPTIME
TOPICAL | Status: DC | PRN
  Administered 2019-05-12: 1000 mL

## 2019-05-12 MED ORDER — FENTANYL CITRATE (PF) 250 MCG/5ML IJ SOLN
INTRAMUSCULAR | Status: AC
  Filled 2019-05-12: qty 5

## 2019-05-12 MED ORDER — PROPOFOL 10 MG/ML IV BOLUS
INTRAVENOUS | Status: AC
  Filled 2019-05-12: qty 20

## 2019-05-12 MED ORDER — MIDAZOLAM HCL 2 MG/2ML IJ SOLN
INTRAMUSCULAR | Status: AC
  Filled 2019-05-12: qty 2

## 2019-05-12 MED ORDER — HYDROCODONE-ACETAMINOPHEN 5-325 MG PO TABS: 1.0000 | ORAL_TABLET | Freq: Four times a day (QID) | ORAL | 0 refills | Status: DC | PRN

## 2019-05-12 SURGICAL SUPPLY — 64 items
BANDAGE ESMARK 6X9 LF (GAUZE/BANDAGES/DRESSINGS) IMPLANT
BENZOIN TINCTURE PRP APPL 2/3 (GAUZE/BANDAGES/DRESSINGS) ×3 IMPLANT
BNDG COHESIVE 6X5 TAN STRL LF (GAUZE/BANDAGES/DRESSINGS) IMPLANT
BNDG ELASTIC 4X5.8 VLCR STR LF (GAUZE/BANDAGES/DRESSINGS) ×3 IMPLANT
BNDG ELASTIC 6X5.8 VLCR STR LF (GAUZE/BANDAGES/DRESSINGS) IMPLANT
BNDG ESMARK 6X9 LF (GAUZE/BANDAGES/DRESSINGS)
BNDG GAUZE ELAST 4 BULKY (GAUZE/BANDAGES/DRESSINGS) IMPLANT
BRUSH SCRUB EZ PLAIN DRY (MISCELLANEOUS) ×3 IMPLANT
CHLORAPREP W/TINT 26 (MISCELLANEOUS) ×3 IMPLANT
CLOSURE WOUND 1/2 X4 (GAUZE/BANDAGES/DRESSINGS) ×1
COVER SURGICAL LIGHT HANDLE (MISCELLANEOUS) ×3 IMPLANT
COVER WAND RF STERILE (DRAPES) IMPLANT
CUFF TOURN SGL QUICK 18X4 (TOURNIQUET CUFF) IMPLANT
CUFF TOURN SGL QUICK 24 (TOURNIQUET CUFF)
CUFF TOURN SGL QUICK 34 (TOURNIQUET CUFF)
CUFF TRNQT CYL 24X4X16.5-23 (TOURNIQUET CUFF) IMPLANT
CUFF TRNQT CYL 34X4.125X (TOURNIQUET CUFF) IMPLANT
DRAPE C-ARM 42X72 X-RAY (DRAPES) ×3 IMPLANT
DRAPE C-ARMOR (DRAPES) ×3 IMPLANT
DRAPE U-SHAPE 47X51 STRL (DRAPES) ×3 IMPLANT
DRSG ADAPTIC 3X8 NADH LF (GAUZE/BANDAGES/DRESSINGS) IMPLANT
DRSG MEPILEX BORDER 4X8 (GAUZE/BANDAGES/DRESSINGS) ×3 IMPLANT
ELECT REM PT RETURN 9FT ADLT (ELECTROSURGICAL) ×3
ELECTRODE REM PT RTRN 9FT ADLT (ELECTROSURGICAL) ×1 IMPLANT
GAUZE SPONGE 4X4 12PLY STRL (GAUZE/BANDAGES/DRESSINGS) IMPLANT
GLOVE BIO SURGEON STRL SZ 6.5 (GLOVE) ×4 IMPLANT
GLOVE BIO SURGEON STRL SZ7.5 (GLOVE) ×6 IMPLANT
GLOVE BIO SURGEONS STRL SZ 6.5 (GLOVE) ×2
GLOVE BIOGEL PI IND STRL 6.5 (GLOVE) ×1 IMPLANT
GLOVE BIOGEL PI IND STRL 7.5 (GLOVE) ×1 IMPLANT
GLOVE BIOGEL PI INDICATOR 6.5 (GLOVE) ×2
GLOVE BIOGEL PI INDICATOR 7.5 (GLOVE) ×2
GOWN STRL REUS W/ TWL LRG LVL3 (GOWN DISPOSABLE) ×3 IMPLANT
GOWN STRL REUS W/TWL LRG LVL3 (GOWN DISPOSABLE) ×6
KIT BASIN OR (CUSTOM PROCEDURE TRAY) ×3 IMPLANT
KIT TURNOVER KIT B (KITS) ×3 IMPLANT
MANIFOLD NEPTUNE II (INSTRUMENTS) IMPLANT
NEEDLE 22X1 1/2 (OR ONLY) (NEEDLE) IMPLANT
NS IRRIG 1000ML POUR BTL (IV SOLUTION) ×3 IMPLANT
PACK ORTHO EXTREMITY (CUSTOM PROCEDURE TRAY) ×3 IMPLANT
PAD ARMBOARD 7.5X6 YLW CONV (MISCELLANEOUS) ×6 IMPLANT
PADDING CAST COTTON 6X4 STRL (CAST SUPPLIES) IMPLANT
SPONGE LAP 18X18 RF (DISPOSABLE) IMPLANT
STAPLER VISISTAT 35W (STAPLE) IMPLANT
STOCKINETTE IMPERVIOUS LG (DRAPES) ×3 IMPLANT
STRIP CLOSURE SKIN 1/2X4 (GAUZE/BANDAGES/DRESSINGS) ×2 IMPLANT
SUCTION FRAZIER HANDLE 10FR (MISCELLANEOUS) ×2
SUCTION TUBE FRAZIER 10FR DISP (MISCELLANEOUS) ×1 IMPLANT
SUT ETHILON 3 0 PS 1 (SUTURE) IMPLANT
SUT MNCRL AB 3-0 PS2 18 (SUTURE) ×3 IMPLANT
SUT MON AB 2-0 CT1 36 (SUTURE) IMPLANT
SUT PDS AB 2-0 CT1 27 (SUTURE) IMPLANT
SUT VIC AB 0 CT1 27 (SUTURE)
SUT VIC AB 0 CT1 27XBRD ANBCTR (SUTURE) IMPLANT
SUT VIC AB 2-0 CT1 27 (SUTURE) ×2
SUT VIC AB 2-0 CT1 TAPERPNT 27 (SUTURE) ×1 IMPLANT
SYR CONTROL 10ML LL (SYRINGE) IMPLANT
TOWEL GREEN STERILE (TOWEL DISPOSABLE) ×3 IMPLANT
TOWEL GREEN STERILE FF (TOWEL DISPOSABLE) IMPLANT
TUBE CONNECTING 12'X1/4 (SUCTIONS)
TUBE CONNECTING 12X1/4 (SUCTIONS) IMPLANT
UNDERPAD 30X30 (UNDERPADS AND DIAPERS) ×3 IMPLANT
WATER STERILE IRR 1000ML POUR (IV SOLUTION) IMPLANT
YANKAUER SUCT BULB TIP NO VENT (SUCTIONS) ×3 IMPLANT
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
## 2019-05-12 NOTE — Anesthesia Postprocedure Evaluation (Signed)
Anesthesia Post Note  Patient: Derek Ford  Procedure(s) Performed: HARDWARE REMOVAL RIGHT ANKLE (Right )     Patient location during evaluation: PACU Anesthesia Type: Regional Level of consciousness: awake and alert Pain management: pain level controlled Vital Signs Assessment: post-procedure vital signs reviewed and stable Respiratory status: spontaneous breathing, nonlabored ventilation, respiratory function stable and patient connected to nasal cannula oxygen Cardiovascular status: stable and blood pressure returned to baseline Postop Assessment: no apparent nausea or vomiting Anesthetic complications: no    Last Vitals:  Vitals:   05/12/19 0609 05/12/19 0823  BP: 123/81 96/72  Pulse: 67   Resp: 18 11  Temp: 37.1 C 36.4 C  SpO2: 100% 100%    Last Pain:  Vitals:   05/12/19 0823  TempSrc:   PainSc: 0-No pain                 Barnet Glasgow

## 2019-05-12 NOTE — Anesthesia Procedure Notes (Signed)
Procedure Name: MAC Date/Time: 05/12/2019 7:25 AM Performed by: Leonor Liv, CRNA Pre-anesthesia Checklist: Patient identified, Emergency Drugs available, Suction available, Patient being monitored and Timeout performed Patient Re-evaluated:Patient Re-evaluated prior to induction Oxygen Delivery Method: Simple face mask Placement Confirmation: positive ETCO2 Dental Injury: Teeth and Oropharynx as per pre-operative assessment

## 2019-05-12 NOTE — Interval H&P Note (Signed)
History and Physical Interval Note:  05/12/2019 7:14 AM  Derek Ford  has presented today for surgery, with the diagnosis of Painful hardware.  The various methods of treatment have been discussed with the patient and family. After consideration of risks, benefits and other options for treatment, the patient has consented to  Procedure(s): HARDWARE REMOVAL RIGHT ANKLE (Right) as a surgical intervention.  The patient's history has been reviewed, patient examined, no change in status, stable for surgery.  I have reviewed the patient's chart and labs.  Questions were answered to the patient's satisfaction.     Lennette Bihari P Fabrizio Filip

## 2019-05-12 NOTE — Transfer of Care (Signed)
Immediate Anesthesia Transfer of Care Note  Patient: Derek Ford  Procedure(s) Performed: HARDWARE REMOVAL RIGHT ANKLE (Right )  Patient Location: PACU  Anesthesia Type:MAC  Level of Consciousness: awake, alert  and oriented  Airway & Oxygen Therapy: Patient Spontanous Breathing  Post-op Assessment: Report given to RN and Post -op Vital signs reviewed and stable  Post vital signs: Reviewed and stable  Last Vitals:  Vitals Value Taken Time  BP 96/72 05/12/19 0825  Temp    Pulse 64 05/12/19 0827  Resp 14 05/12/19 0827  SpO2 99 % 05/12/19 0827  Vitals shown include unvalidated device data.  Last Pain:  Vitals:   05/12/19 0823  TempSrc:   PainSc: (P) 0-No pain      Patients Stated Pain Goal: 4 (01/[birth year] 7482)  Complications: No apparent anesthesia complications

## 2019-05-12 NOTE — Anesthesia Procedure Notes (Signed)
Anesthesia Regional Block: Popliteal block   Pre-Anesthetic Checklist: ,, timeout performed, Correct Patient, Correct Site, Correct Laterality, Correct Procedure, Correct Position, site marked, Risks and benefits discussed, pre-op evaluation,  At surgeon's request and post-op pain management  Laterality: Right  Prep: Maximum Sterile Barrier Precautions used, chloraprep       Needles:  Injection technique: Single-shot  Needle Type: Echogenic Needle     Needle Length: 9cm  Needle Gauge: 21     Additional Needles:   Procedures:,,,, ultrasound used (permanent image in chart),,,,  Narrative:  Start time: 05/12/2019 7:02 AM End time: 05/12/2019 7:09 AM Injection made incrementally with aspirations every 5 mL.  Performed by: Personally  Anesthesiologist: Barnet Glasgow, MD  Additional Notes: Block assessed. Patient tolerated procedure well.

## 2019-05-12 NOTE — OR Nursing (Signed)
Hardware cleaned by Dr. Doreatha Martin and to be sent with patient per Dr. Doreatha Martin.

## 2019-05-12 NOTE — Discharge Instructions (Addendum)
[facility]th[facility] Trauma Service Discharge Instructions   General Discharge Instructions  WEIGHT BEARING STATUS: weightbearing as tolerated  RANGE OF MOTION/ACTIVITY: Unrestricted motion of the right ankle  Wound Care:Okay to remove surgical dressing on Wednesday 05/14/19. Leave steri-strips in place. Incisions can be left open to air if there is no drainage. If incision continues to have drainage, follow wound care instructions below. Okay to shower and get incisions wet starting on Wednesday  DVT/PE prophylaxis: None  Diet: as you were eating previously.  Can use over the counter stool softeners and bowel preparations, such as Miralax, to help with bowel movements.  Narcotics can be constipating.  Be sure to drink plenty of fluids  PAIN MEDICATION USE AND EXPECTATIONS  You have likely been given narcotic medications to help control your pain.  After a traumatic event that results in an fracture (broken bone) with or without surgery, it is ok to use narcotic pain medications to help control one's pain.  We understand that everyone responds to pain differently and each individual patient will be evaluated on a regular basis for the continued need for narcotic medications. Ideally, narcotic medication use should last no more than 6-8 weeks (coinciding with fracture healing).   As a patient it is your responsibility as well to monitor narcotic medication use and report the amount and frequency you use these medications when you come to your office visit.   We would also advise that if you are using narcotic medications, you should take a dose prior to therapy to maximize you participation.  IF YOU ARE ON NARCOTIC MEDICATIONS IT IS NOT PERMISSIBLE TO OPERATE A MOTOR VEHICLE (MOTORCYCLE/CAR/TRUCK/MOPED) OR HEAVY MACHINERY DO NOT MIX NARCOTICS WITH OTHER CNS (CENTRAL NERVOUS SYSTEM) DEPRESSANTS SUCH AS ALCOHOL   STOP SMOKING OR USING NICOTINE PRODUCTS!!!!  As discussed nicotine severely impairs your  body's ability to heal surgical and traumatic wounds but also impairs bone healing.  Wounds and bone heal by forming microscopic blood vessels (angiogenesis) and nicotine is a vasoconstrictor (essentially, shrinks blood vessels).  Therefore, if vasoconstriction occurs to these microscopic blood vessels they essentially disappear and are unable to deliver necessary nutrients to the healing tissue.  This is one modifiable factor that you can do to dramatically increase your chances of healing your injury.    (This means no smoking, no nicotine gum, patches, etc)  DO NOT USE NONSTEROIDAL ANTI-INFLAMMATORY DRUGS (NSAID'S)  Using products such as Advil (ibuprofen), Aleve (naproxen), Motrin (ibuprofen) for additional pain control during fracture healing can delay and/or prevent the healing response.  If you would like to take over the counter (OTC) medication, Tylenol (acetaminophen) is ok.  However, some narcotic medications that are given for pain control contain acetaminophen as well. Therefore, you should not exceed more than 4000 mg of tylenol in a day if you do not have liver disease.  Also note that there are may OTC medicines, such as cold medicines and allergy medicines that my contain tylenol as well.  If you have any questions about medications and/or interactions please ask your doctor/PA or your pharmacist.      ICE AND ELEVATE INJURED/OPERATIVE EXTREMITY  Using ice and elevating the injured extremity above your heart can help with swelling and pain control.  Icing in a pulsatile fashion, such as 20 minutes on and 20 minutes off, can be followed.    Do not place ice directly on skin. Make sure there is a barrier between to skin and the ice pack.  Using frozen items such as frozen peas works well as the conform nicely to the are that needs to be iced.  USE AN ACE WRAP OR TED HOSE FOR SWELLING CONTROL  In addition to icing and elevation, Ace wraps or TED hose are used to help limit and resolve  swelling.  It is recommended to use Ace wraps or TED hose until you are informed to stop.    When using Ace Wraps start the wrapping distally (farthest away from the body) and wrap proximally (closer to the body)   Example: If you had surgery on your leg or thing and you do not have a splint on, start the ace wrap at the toes and work your way up to the thigh        If you had surgery on your upper extremity and do not have a splint on, start the ace wrap at your fingers and work your way up to the upper arm     [place]: [id number]   VISIT OUR WEBSITE FOR ADDITIONAL INFORMATION: [email or web address]   Discharge Wound Care Instructions  Do NOT apply any ointments, solutions or lotions to pin sites or surgical wounds.  These prevent needed drainage and even though solutions like hydrogen peroxide kill bacteria, they also damage cells lining the pin sites that help fight infection.  Applying lotions or ointments can keep the wounds moist and can cause them to breakdown and open up as well. This can increase the risk for infection. When in doubt call the office.  Surgical incisions should be dressed daily.  If any drainage is noted, use one layer of adaptic, then gauze, Kerlix, and an ace wrap.  Once the incision is completely dry and without drainage, it may be left open to air out.  Showering may begin 36-48 hours later.  Cleaning gently with soap and water.  Traumatic wounds should be dressed daily as well.    One layer of adaptic, gauze, Kerlix, then ace wrap.  The adaptic can be discontinued once the draining has ceased    If you have a wet to dry dressing: wet the gauze with saline the squeeze as much saline out so the gauze is moist (not soaking wet), place moistened gauze over wound, then place a dry gauze over the moist one, followed by Kerlix wrap, then ace wrap.

## 2019-05-12 NOTE — Anesthesia Procedure Notes (Signed)
Anesthesia Regional Block: Adductor canal block   Pre-Anesthetic Checklist: ,, timeout performed, Correct Patient, Correct Site, Correct Laterality, Correct Procedure, Correct Position, site marked, Risks and benefits discussed,  Surgical consent,  Pre-op evaluation,  At surgeon's request and post-op pain management  Laterality: Right  Prep: chloraprep       Needles:  Injection technique: Single-shot  Needle Type: Echogenic Needle     Needle Length: 9cm  Needle Gauge: 22     Additional Needles:   Procedures:,,,, ultrasound used (permanent image in chart),,,,  Narrative:  Start time: 05/12/2019 7:10 AM End time: 05/12/2019 7:15 AM Injection made incrementally with aspirations every 5 mL.  Performed by: Personally  Anesthesiologist: Barnet Glasgow, MD  Additional Notes: Block assessed prior to surgery. Pt tolerated procedure well.

## 2019-05-30 ENCOUNTER — Ambulatory Visit: Attending: Internal Medicine

## 2019-05-30 DIAGNOSIS — Z23 Encounter for immunization: Secondary | ICD-10-CM

## 2019-05-30 NOTE — Progress Notes (Signed)
   Covid-19 Vaccination Clinic  Name:  Derek Ford    MRN: [id number] DOB: [birth year]  05/30/2019  Mr. Derek Ford was observed post Covid-19 immunization for 15 minutes without incident. He was provided with Vaccine Information Sheet and instruction to access the V-Safe system.   Mr. Derek Ford was instructed to call 911 with any severe reactions post vaccine: Marland Kitchen Difficulty breathing  . Swelling of face and throat  . A fast heartbeat  . A bad rash all over body  . Dizziness and weakness   Immunizations Administered    Name Date Dose VIS Date Route   Pfizer COVID-19 Vaccine 05/30/2019  9:44 AM 0.3 mL 02/14/2019 Intramuscular   Manufacturer: [place]   Lot: [id number]   [place]: [id number]

## 2019-06-23 ENCOUNTER — Ambulatory Visit: Attending: Internal Medicine

## 2019-06-23 DIAGNOSIS — Z23 Encounter for immunization: Secondary | ICD-10-CM

## 2019-06-23 NOTE — Progress Notes (Signed)
   Covid-19 Vaccination Clinic  Name:  Derek Ford    MRN: [id number] DOB: September 22, 1965  06/23/2019  Derek Ford was observed post Covid-19 immunization for 15 minutes without incident. He was provided with Vaccine Information Sheet and instruction to access the V-Safe system.   Derek Ford was instructed to call 911 with any severe reactions post vaccine: Marland Kitchen Difficulty breathing  . Swelling of face and throat  . A fast heartbeat  . A bad rash all over body  . Dizziness and weakness   Immunizations Administered    Name Date Dose VIS Date Route   Pfizer COVID-19 Vaccine 06/23/2019  2:45 PM 0.3 mL 04/30/2018 Intramuscular   Manufacturer: [place]   Lot: [id number]   [place]: [id number]

## 2020-06-28 ENCOUNTER — Other Ambulatory Visit: Admission: RE | Admit: 2020-06-28 | Source: Ambulatory Visit

## 2020-06-29 ENCOUNTER — Encounter: Payer: Self-pay | Admitting: Internal Medicine

## 2020-06-30 ENCOUNTER — Ambulatory Visit: Admitting: Registered Nurse

## 2020-06-30 ENCOUNTER — Encounter
Admission: RE | Disposition: A | Discharge status: Home / Self Care | Payer: Self-pay | Source: Home / Self Care | Attending: Internal Medicine

## 2020-06-30 ENCOUNTER — Encounter: Payer: Self-pay | Admitting: Internal Medicine

## 2020-06-30 ENCOUNTER — Ambulatory Visit
Admission: RE | Admit: 2020-06-30 | Discharge: 2020-06-30 | Disposition: A | Discharge status: Home / Self Care | Attending: Internal Medicine | Admitting: Internal Medicine

## 2020-06-30 DIAGNOSIS — Z1211 Encounter for screening for malignant neoplasm of colon: Secondary | ICD-10-CM | POA: Diagnosis not present

## 2020-06-30 DIAGNOSIS — Z79899 Other long term (current) drug therapy: Secondary | ICD-10-CM | POA: Diagnosis not present

## 2020-06-30 DIAGNOSIS — K640 First degree hemorrhoids: Secondary | ICD-10-CM | POA: Diagnosis not present

## 2020-06-30 DIAGNOSIS — K5730 Diverticulosis of large intestine without perforation or abscess without bleeding: Secondary | ICD-10-CM | POA: Insufficient documentation

## 2020-06-30 DIAGNOSIS — Z7952 Long term (current) use of systemic steroids: Secondary | ICD-10-CM | POA: Diagnosis not present

## 2020-06-30 HISTORY — PX: COLONOSCOPY WITH PROPOFOL: SHX5780

## 2020-06-30 SURGERY — COLONOSCOPY WITH PROPOFOL
Anesthesia: General

## 2020-06-30 MED ORDER — SODIUM CHLORIDE 0.9 % IV SOLN
INTRAVENOUS | Status: DC
  Administered 2020-06-30: 1000 mL via INTRAVENOUS

## 2020-06-30 MED ORDER — PROPOFOL 500 MG/50ML IV EMUL
INTRAVENOUS | Status: DC | PRN
  Administered 2020-06-30: 150 ug/kg/min via INTRAVENOUS

## 2020-06-30 MED ORDER — PROPOFOL 10 MG/ML IV BOLUS
INTRAVENOUS | Status: DC | PRN
  Administered 2020-06-30: 90 mg via INTRAVENOUS

## 2020-06-30 MED ORDER — LIDOCAINE HCL (CARDIAC) PF 100 MG/5ML IV SOSY
PREFILLED_SYRINGE | INTRAVENOUS | Status: DC | PRN
  Administered 2020-06-30: 100 mg via INTRAVENOUS

## 2020-06-30 NOTE — H&P (Signed)
Outpatient short stay form Pre-procedure 06/30/2020 1:17 PM Derek Ford K. Alice Reichert, M.D.  Primary Physician: Derinda Late, M.D.  Reason for visit:  Personal history of colon polyps (tubular adenoma - 2017)  History of present illness:                            Patient presents for colonoscopy for a personal hx of colon polyps. The patient denies abdominal pain, abnormal weight loss or rectal bleeding.  Date of Colon: 04/12/2015  Physician: Dr. Vira Agar  Polyps Removed: 1 Tubular Adenoma  Recommended 5 year repeat     No current facility-administered medications for this encounter.  Medications Prior to Admission  Medication Sig Dispense Refill Last Dose  . chlorpheniramine-HYDROcodone (TUSSIONEX PENNKINETIC ER) 10-8 MG/5ML SUER Take 5 mLs by mouth every 12 (twelve) hours as needed for cough.     . fluticasone (FLONASE) 50 MCG/ACT nasal spray Place 1 spray into both nostrils daily.     . predniSONE (DELTASONE) 10 MG tablet Take 10 mg by mouth daily with breakfast.     . sildenafil (REVATIO) 20 MG tablet Take 20 mg by mouth daily as needed. Prior to sex     . citalopram (CELEXA) 20 MG tablet Take 20 mg by mouth at bedtime.     Marland Kitchen HYDROcodone-acetaminophen (NORCO/VICODIN) 5-325 MG tablet Take 1 tablet by mouth every 6 (six) hours as needed for severe pain. 15 tablet 0   . loratadine-pseudoephedrine (CLARITIN-D 24-HOUR) 10-240 MG 24 hr tablet Take 1 tablet by mouth daily as needed for allergies (congestion).         No Known Allergies   Past Medical History:  Diagnosis Date  . Anxiety   . Erectile dysfunction   . Hypotestosteronism   . Motion sickness    ocean ships  . Rosacea   . Sleep apnea    CPAP    Review of systems:  Otherwise negative.    Physical Exam  Gen: Alert, oriented. Appears stated age.  HEENT: [place]/AT. PERRLA. Lungs: CTA, no wheezes. CV: RR nl S1, S2. Abd: soft, benign, no masses. BS+ Ext: No edema. Pulses 2+    Planned procedures: Proceed with  colonoscopy. The patient understands the nature of the planned procedure, indications, risks, alternatives and potential complications including but not limited to bleeding, infection, perforation, damage to internal organs and possible oversedation/side effects from anesthesia. The patient agrees and gives consent to proceed.  Please refer to procedure notes for findings, recommendations and patient disposition/instructions.     [name] K. Alice Reichert, M.D. Gastroenterology 06/30/2020  1:17 PM

## 2020-06-30 NOTE — Op Note (Signed)
[facility] Patient Name: Derek Ford Procedure Date: 06/30/2020 1:53 PM MRN: [id number] Account #: [id number] Date of Birth: 28-Oct-1965 Admit Type: Outpatient Age: 55 Room: [facility] ENDO ROOM 2 Gender: Male Note Status: Finalized Procedure:             Colonoscopy Indications:           Surveillance: Personal history of adenomatous polyps                         on last colonoscopy > 5 years ago Providers:             Lorie Apley K. Alice Reichert MD, MD Referring MD:          Caprice Renshaw MD (Referring MD) Medicines:             Propofol per Anesthesia Complications:         No immediate complications. Procedure:             Pre-Anesthesia Assessment:                        - The risks and benefits of the procedure and the                         sedation options and risks were discussed with the                         patient. All questions were answered and informed                         consent was obtained.                        - Patient identification and proposed procedure were                         verified prior to the procedure by the nurse. The                         procedure was verified in the procedure room.                        - ASA Grade Assessment: III - A patient with severe                         systemic disease.                        - After reviewing the risks and benefits, the patient                         was deemed in satisfactory condition to undergo the                         procedure.                        After obtaining informed consent, the colonoscope was                         passed under direct  vision. Throughout the procedure,                         the patient's blood pressure, pulse, and oxygen                         saturations were monitored continuously. The                         Colonoscope was introduced through the anus and                         advanced to the the cecum,  identified by appendiceal                         orifice and ileocecal valve. The colonoscopy was                         performed without difficulty. The patient tolerated                         the procedure well. The quality of the bowel                         preparation was adequate. The ileocecal valve,                         appendiceal orifice, and rectum were photographed. Findings:      The perianal and digital rectal examinations were normal. Pertinent       negatives include normal sphincter tone and no palpable rectal lesions.      Non-bleeding internal hemorrhoids were found during retroflexion. The       hemorrhoids were Grade I (internal hemorrhoids that do not prolapse).      Many small-mouthed diverticula were found in the sigmoid colon.      The exam was otherwise without abnormality. Impression:            - Non-bleeding internal hemorrhoids.                        - Diverticulosis in the sigmoid colon.                        - The examination was otherwise normal.                        - No specimens collected. Recommendation:        - Patient has a contact number available for                         emergencies. The signs and symptoms of potential                         delayed complications were discussed with the patient.                         Return to normal activities tomorrow. Written                         discharge  instructions were provided to the patient.                        - Resume previous diet.                        - Continue present medications.                        - Repeat colonoscopy in 5 years for surveillance.                        - Return to GI office PRN.                        - The findings and recommendations were discussed with                         the patient. Procedure Code(s):     --- Professional ---                        U4403, Colorectal cancer screening; colonoscopy on                         individual at  high risk Diagnosis Code(s):     --- Professional ---                        K57.30, Diverticulosis of large intestine without                         perforation or abscess without bleeding                        K64.0, First degree hemorrhoids                        Z86.010, Personal history of colonic polyps CPT copyright 2019 American Medical Association. All rights reserved. The codes documented in this report are preliminary and upon coder review may  be revised to meet current compliance requirements. Efrain Sella MD, MD 06/30/2020 2:22:28 PM This report has been signed electronically. Number of Addenda: 0 Note Initiated On: 06/30/2020 1:53 PM Scope Withdrawal Time: 0 hours 7 minutes 38 seconds  Total Procedure Duration: 0 hours 11 minutes 34 seconds  Estimated Blood Loss:  Estimated blood loss: none.      [facility]
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
## 2020-06-30 NOTE — Interval H&P Note (Signed)
History and Physical Interval Note:  06/30/2020 1:18 PM  Derek Ford  has presented today for surgery, with the diagnosis of [place].  The various methods of treatment have been discussed with the patient and family. After consideration of risks, benefits and other options for treatment, the patient has consented to  Procedure(s): COLONOSCOPY WITH PROPOFOL (N/A) as a surgical intervention.  The patient's history has been reviewed, patient examined, no change in status, stable for surgery.  I have reviewed the patient's chart and labs.  Questions were answered to the patient's satisfaction.     [place], [place]

## 2020-06-30 NOTE — Anesthesia Postprocedure Evaluation (Signed)
Anesthesia Post Note  Patient: Derek Ford  Procedure(s) Performed: COLONOSCOPY WITH PROPOFOL (N/A )  Patient location during evaluation: Endoscopy Anesthesia Type: General Level of consciousness: awake and alert and oriented Pain management: pain level controlled Vital Signs Assessment: post-procedure vital signs reviewed and stable Respiratory status: spontaneous breathing, nonlabored ventilation and respiratory function stable Cardiovascular status: blood pressure returned to baseline and stable Postop Assessment: no signs of nausea or vomiting Anesthetic complications: no   No complications documented.   Last Vitals:  Vitals:   06/30/20 1330 06/30/20 1423  BP: (!) 130/97   Pulse: 71   Resp: 20   Temp: 36.4 C (!) 35.8 C  SpO2: 99%     Last Pain:  Vitals:   06/30/20 1433  TempSrc:   PainSc: 0-No pain                 Darely Becknell

## 2020-06-30 NOTE — Transfer of Care (Signed)
Immediate Anesthesia Transfer of Care Note  Patient: Derek Ford  Procedure(s) Performed: COLONOSCOPY WITH PROPOFOL (N/A )  Patient Location: Endoscopy Unit  Anesthesia Type:General  Level of Consciousness: drowsy  Airway & Oxygen Therapy: Patient Spontanous Breathing  Post-op Assessment: Report given to RN and Post -op Vital signs reviewed and stable  Post vital signs: Reviewed and stable  Last Vitals:  Vitals Value Taken Time  BP 105/65 06/30/20 1423  Temp    Pulse 71 06/30/20 1423  Resp 16 06/30/20 1423  SpO2 99 % 06/30/20 1423  Vitals shown include unvalidated device data.  Last Pain: There were no vitals filed for this visit.    Patients Stated Pain Goal: 0 (26/71/24 5809)  Complications: No complications documented.

## 2020-07-01 ENCOUNTER — Encounter: Payer: Self-pay | Admitting: Internal Medicine
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
## 2020-11-10 ENCOUNTER — Emergency Department

## 2020-11-10 ENCOUNTER — Emergency Department
Admission: EM | Admit: 2020-11-10 | Discharge: 2020-11-10 | Disposition: A | Discharge status: Home / Self Care | Attending: Emergency Medicine | Admitting: Emergency Medicine

## 2020-11-10 ENCOUNTER — Other Ambulatory Visit: Payer: Self-pay

## 2020-11-10 DIAGNOSIS — Y9389 Activity, other specified: Secondary | ICD-10-CM | POA: Diagnosis not present

## 2020-11-10 DIAGNOSIS — S5002XA Contusion of left elbow, initial encounter: Secondary | ICD-10-CM | POA: Insufficient documentation

## 2020-11-10 DIAGNOSIS — W010XXA Fall on same level from slipping, tripping and stumbling without subsequent striking against object, initial encounter: Secondary | ICD-10-CM | POA: Insufficient documentation

## 2020-11-10 DIAGNOSIS — S59902A Unspecified injury of left elbow, initial encounter: Secondary | ICD-10-CM | POA: Diagnosis present

## 2020-11-10 DIAGNOSIS — F17220 Nicotine dependence, chewing tobacco, uncomplicated: Secondary | ICD-10-CM | POA: Diagnosis not present

## 2020-11-10 MED ORDER — MELOXICAM 15 MG PO TABS: 15.0000 mg | ORAL_TABLET | Freq: Every day | ORAL | 0 refills | Status: DC
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
## 2020-11-10 NOTE — ED Notes (Signed)
See triage note  States he was trying to stop his dogs from fighting  Fell into fireplace  Laceration to left elbow

## 2020-11-10 NOTE — ED Triage Notes (Signed)
Pt states he was break up a fight with his dogs and tripped and fell landing on the fireplace, pt as a puncture wound to the left elbow with controlled bleeding, swelling noted. Pt is in NAD

## 2021-11-17 DIAGNOSIS — M545 Low back pain, unspecified: Secondary | ICD-10-CM | POA: Insufficient documentation

## 2022-08-10 ENCOUNTER — Emergency Department

## 2022-08-10 ENCOUNTER — Other Ambulatory Visit: Payer: Self-pay

## 2022-08-10 ENCOUNTER — Encounter: Payer: Self-pay | Admitting: *Deleted

## 2022-08-10 DIAGNOSIS — R197 Diarrhea, unspecified: Secondary | ICD-10-CM | POA: Diagnosis not present

## 2022-08-10 DIAGNOSIS — E860 Dehydration: Secondary | ICD-10-CM | POA: Insufficient documentation

## 2022-08-10 DIAGNOSIS — R000 Tachycardia, unspecified: Secondary | ICD-10-CM | POA: Diagnosis not present

## 2022-08-10 DIAGNOSIS — R112 Nausea with vomiting, unspecified: Secondary | ICD-10-CM | POA: Diagnosis not present

## 2022-08-10 DIAGNOSIS — R1084 Generalized abdominal pain: Secondary | ICD-10-CM | POA: Insufficient documentation

## 2022-08-10 DIAGNOSIS — R55 Syncope and collapse: Secondary | ICD-10-CM | POA: Insufficient documentation

## 2022-08-10 DIAGNOSIS — D72829 Elevated white blood cell count, unspecified: Secondary | ICD-10-CM | POA: Diagnosis not present

## 2022-08-10 LAB — HEPATIC FUNCTION PANEL
ALT: 29 U/L (ref 0–44)
AST: 29 U/L (ref 15–41)
Albumin: 4.9 g/dL (ref 3.5–5.0)
Alkaline Phosphatase: 61 U/L (ref 38–126)
Bilirubin, Direct: <0.1 mg/dL (ref 0.0–0.2)
Total Bilirubin: 1.2 mg/dL (ref 0.3–1.2)
Total Protein: 7.9 g/dL (ref 6.5–8.1)

## 2022-08-10 LAB — TROPONIN I (HIGH SENSITIVITY): Troponin I (High Sensitivity): 5 ng/L (ref <18)

## 2022-08-10 LAB — CBC WITH DIFFERENTIAL/PLATELET
Abs Immature Granulocytes: 0.07 10*3/uL (ref 0.00–0.07)
Basophils Absolute: 0.1 10*3/uL (ref 0.0–0.1)
Basophils Relative: 0 %
Eosinophils Absolute: 0.1 10*3/uL (ref 0.0–0.5)
Eosinophils Relative: 0 %
HCT: 48.5 % (ref 39.0–52.0)
Hemoglobin: 16.4 g/dL (ref 13.0–17.0)
Immature Granulocytes: 0 %
Lymphocytes Relative: 9 %
Lymphs Abs: 1.7 10*3/uL (ref 0.7–4.0)
MCH: 30.3 pg (ref 26.0–34.0)
MCHC: 33.8 g/dL (ref 30.0–36.0)
MCV: 89.6 fL (ref 80.0–100.0)
Monocytes Absolute: 1.3 10*3/uL — ABNORMAL HIGH (ref 0.1–1.0)
Monocytes Relative: 7 %
Neutro Abs: 15.6 10*3/uL — ABNORMAL HIGH (ref 1.7–7.7)
Neutrophils Relative %: 84 %
Platelets: 325 10*3/uL (ref 150–400)
RBC: 5.41 MIL/uL (ref 4.22–5.81)
RDW: 12.4 % (ref 11.5–15.5)
WBC: 18.8 10*3/uL — ABNORMAL HIGH (ref 4.0–10.5)
nRBC: 0 % (ref 0.0–0.2)

## 2022-08-10 LAB — CBC
HCT: 49.1 % (ref 39.0–52.0)
Hemoglobin: 16.4 g/dL (ref 13.0–17.0)
MCH: 30 pg (ref 26.0–34.0)
MCHC: 33.4 g/dL (ref 30.0–36.0)
MCV: 89.8 fL (ref 80.0–100.0)
Platelets: 313 10*3/uL (ref 150–400)
RBC: 5.47 MIL/uL (ref 4.22–5.81)
RDW: 12.4 % (ref 11.5–15.5)
WBC: 19.1 10*3/uL — ABNORMAL HIGH (ref 4.0–10.5)
nRBC: 0 % (ref 0.0–0.2)

## 2022-08-10 LAB — BASIC METABOLIC PANEL
Anion gap: 15 (ref 5–15)
BUN: 12 mg/dL (ref 6–20)
CO2: 23 mmol/L (ref 22–32)
Calcium: 10 mg/dL (ref 8.9–10.3)
Chloride: 101 mmol/L (ref 98–111)
Creatinine, Ser: 1.17 mg/dL (ref 0.61–1.24)
GFR, Estimated: >60 mL/min (ref >60)
Glucose, Bld: 160 mg/dL — ABNORMAL HIGH (ref 70–99)
Potassium: 4 mmol/L (ref 3.5–5.1)
Sodium: 139 mmol/L (ref 135–145)

## 2022-08-10 LAB — LIPASE, BLOOD: Lipase: 32 U/L (ref 11–51)

## 2022-08-10 NOTE — ED Triage Notes (Signed)
Pt reports abd pain with diarrhea and vomiting  sx began at 1800.  Pt reports passing out in the bathroom, falling off the toilet.  Pt has a headache.  Pt alert   speech clear.

## 2022-08-11 ENCOUNTER — Emergency Department

## 2022-08-11 ENCOUNTER — Emergency Department
Admission: EM | Admit: 2022-08-11 | Discharge: 2022-08-11 | Disposition: A | Discharge status: Home / Self Care | Attending: Emergency Medicine | Admitting: Emergency Medicine

## 2022-08-11 DIAGNOSIS — R112 Nausea with vomiting, unspecified: Secondary | ICD-10-CM

## 2022-08-11 DIAGNOSIS — R1084 Generalized abdominal pain: Secondary | ICD-10-CM

## 2022-08-11 DIAGNOSIS — E860 Dehydration: Secondary | ICD-10-CM

## 2022-08-11 DIAGNOSIS — R55 Syncope and collapse: Secondary | ICD-10-CM

## 2022-08-11 LAB — TROPONIN I (HIGH SENSITIVITY): Troponin I (High Sensitivity): 5 ng/L (ref <18)

## 2022-08-11 MED ORDER — ONDANSETRON HCL 4 MG/2ML IJ SOLN
4.0000 mg | Freq: Once | INTRAMUSCULAR | Status: AC
  Administered 2022-08-11: 4 mg via INTRAVENOUS
  Filled 2022-08-11: qty 2

## 2022-08-11 MED ORDER — ONDANSETRON 4 MG PO TBDP: 4.0000 mg | ORAL_TABLET | Freq: Three times a day (TID) | ORAL | 0 refills | Status: DC | PRN

## 2022-08-11 MED ORDER — SODIUM CHLORIDE 0.9 % IV BOLUS
1000.0000 mL | Freq: Once | INTRAVENOUS | Status: AC
  Administered 2022-08-11: 1000 mL via INTRAVENOUS

## 2022-08-11 MED ORDER — IOHEXOL 300 MG/ML  SOLN
100.0000 mL | Freq: Once | INTRAMUSCULAR | Status: AC | PRN
  Administered 2022-08-11: 100 mL via INTRAVENOUS

## 2022-08-11 MED ORDER — FAMOTIDINE IN NACL 20-0.9 MG/50ML-% IV SOLN
20.0000 mg | Freq: Once | INTRAVENOUS | Status: AC
  Administered 2022-08-11: 20 mg via INTRAVENOUS
  Filled 2022-08-11: qty 50

## 2022-08-11 MED ORDER — KETOROLAC TROMETHAMINE 30 MG/ML IJ SOLN
15.0000 mg | Freq: Once | INTRAMUSCULAR | Status: AC
  Administered 2022-08-11: 15 mg via INTRAVENOUS
  Filled 2022-08-11: qty 1
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
## 2022-08-11 NOTE — Discharge Instructions (Signed)
You may take Zofran as needed for nausea/vomiting.  Clear liquids x 12 hours, then BRAT diet x 3 days, then slowly advance diet as tolerated.  Return to the ER for worsening symptoms, persistent vomiting, difficulty breathing or other concerns. 
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
## 2023-03-19 DIAGNOSIS — D369 Benign neoplasm, unspecified site: Secondary | ICD-10-CM | POA: Insufficient documentation

## 2023-03-19 DIAGNOSIS — Z8249 Family history of ischemic heart disease and other diseases of the circulatory system: Secondary | ICD-10-CM | POA: Insufficient documentation

## 2023-03-19 DIAGNOSIS — Z87828 Personal history of other (healed) physical injury and trauma: Secondary | ICD-10-CM | POA: Insufficient documentation

## 2023-03-19 DIAGNOSIS — E782 Mixed hyperlipidemia: Secondary | ICD-10-CM | POA: Insufficient documentation

## 2023-03-21 ENCOUNTER — Other Ambulatory Visit: Payer: Self-pay | Admitting: Internal Medicine

## 2023-03-21 DIAGNOSIS — Z8249 Family history of ischemic heart disease and other diseases of the circulatory system: Secondary | ICD-10-CM

## 2023-03-21 DIAGNOSIS — E782 Mixed hyperlipidemia: Secondary | ICD-10-CM

## 2023-03-21 DIAGNOSIS — Z0000 Encounter for general adult medical examination without abnormal findings: Secondary | ICD-10-CM

## 2023-04-04 ENCOUNTER — Ambulatory Visit
Admission: RE | Admit: 2023-04-04 | Discharge: 2023-04-04 | Disposition: A | Discharge status: Home / Self Care | Payer: Self-pay | Source: Ambulatory Visit | Attending: Internal Medicine | Admitting: Internal Medicine

## 2023-04-04 DIAGNOSIS — Z8249 Family history of ischemic heart disease and other diseases of the circulatory system: Secondary | ICD-10-CM | POA: Insufficient documentation

## 2023-04-04 DIAGNOSIS — Z0000 Encounter for general adult medical examination without abnormal findings: Secondary | ICD-10-CM | POA: Insufficient documentation

## 2023-04-04 DIAGNOSIS — E782 Mixed hyperlipidemia: Secondary | ICD-10-CM | POA: Insufficient documentation

## 2023-11-22 ENCOUNTER — Ambulatory Visit
Admission: RE | Admit: 2023-11-22 | Discharge: 2023-11-22 | Disposition: A | Discharge status: Home / Self Care | Source: Ambulatory Visit | Attending: Internal Medicine | Admitting: Internal Medicine

## 2023-11-22 ENCOUNTER — Other Ambulatory Visit: Payer: Self-pay | Admitting: Internal Medicine

## 2023-11-22 DIAGNOSIS — G4453 Primary thunderclap headache: Secondary | ICD-10-CM

## 2023-11-22 DIAGNOSIS — R519 Headache, unspecified: Secondary | ICD-10-CM | POA: Insufficient documentation

## 2023-11-22 DIAGNOSIS — G4482 Headache associated with sexual activity: Secondary | ICD-10-CM | POA: Diagnosis present

## 2023-11-22 MED ORDER — IOHEXOL 350 MG/ML SOLN
75.0000 mL | Freq: Once | INTRAVENOUS | Status: AC | PRN
  Administered 2023-11-22: 75 mL via INTRAVENOUS
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
## 2023-11-23 ENCOUNTER — Ambulatory Visit (INDEPENDENT_AMBULATORY_CARE_PROVIDER_SITE_OTHER): Admitting: Vascular Surgery

## 2023-11-23 ENCOUNTER — Encounter (INDEPENDENT_AMBULATORY_CARE_PROVIDER_SITE_OTHER): Payer: Self-pay | Admitting: Vascular Surgery

## 2023-11-23 VITALS — BP 150/96 | HR 76 | Ht 74.0 in | Wt 197.2 lb

## 2023-11-23 DIAGNOSIS — I158 Other secondary hypertension: Secondary | ICD-10-CM | POA: Diagnosis not present

## 2023-11-23 DIAGNOSIS — I7774 Dissection of vertebral artery: Secondary | ICD-10-CM

## 2023-11-23 MED ORDER — ASPIRIN 81 MG PO TBEC: 81.0000 mg | DELAYED_RELEASE_TABLET | Freq: Every day | ORAL | 2 refills | Status: AC

## 2023-11-23 MED ORDER — CLOPIDOGREL BISULFATE 75 MG PO TABS: 75.0000 mg | ORAL_TABLET | Freq: Every day | ORAL | 5 refills | Status: AC

## 2023-11-26 ENCOUNTER — Encounter (INDEPENDENT_AMBULATORY_CARE_PROVIDER_SITE_OTHER): Payer: Self-pay | Admitting: Vascular Surgery

## 2023-11-26 ENCOUNTER — Telehealth (INDEPENDENT_AMBULATORY_CARE_PROVIDER_SITE_OTHER): Payer: Self-pay | Admitting: Vascular Surgery

## 2023-11-26 DIAGNOSIS — I10 Essential (primary) hypertension: Secondary | ICD-10-CM | POA: Insufficient documentation

## 2023-11-26 NOTE — Progress Notes (Signed)
 MRN : [id number]  Derek Ford is a 58 y.o. (Jun 12, 1965) male who presents with chief complaint of check carotid arteries.  History of Present Illness:   I am asked to see the patient by Dr. Oneil Pinal.  Derek Ford is a 58 year old gentleman who approximately 1 week ago had the abrupt onset of neck pain and headache.  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  Flow was preserved.  He was seen by Dr. Pinal November 22, 2023 and subsequently a CT angio head and neck was ordered.  This was read as a right vertebral artery dissection acute versus chronic.  He also notes that he has been relatively hypertensive since the pain began which is unusual for him.  Current Meds  Medication Sig   aspirin  EC 81 MG tablet Take 1 tablet (81 mg total) by mouth daily.   clopidogrel  (PLAVIX ) 75 MG tablet Take 1 tablet (75 mg total) by mouth daily.   fluticasone (FLONASE) 50 MCG/ACT nasal spray Place 1 spray into both nostrils daily.   ibuprofen  (ADVIL ) 200 MG tablet Take 800 mg by mouth.   loratadine-pseudoephedrine (CLARITIN-D 24-HOUR) 10-240 MG 24 hr tablet Take 1 tablet by mouth daily as needed for allergies (congestion).    losartan-hydrochlorothiazide (HYZAAR) 50-12.5 MG tablet Take 1 tablet by mouth daily.   sildenafil (REVATIO) 20 MG tablet Take 20 mg by mouth daily as needed. Prior to sex    Past Medical History:  Diagnosis Date   Anxiety    Erectile dysfunction    Hypotestosteronism    Motion sickness    ocean ships   Rosacea    Sleep apnea    CPAP    Past Surgical History:  Procedure Laterality Date   APPENDECTOMY     COLONOSCOPY WITH PROPOFOL  N/A 04/12/2015   Procedure: COLONOSCOPY WITH PROPOFOL ;  Surgeon: Lamar ONEIDA Holmes, MD;  Location: [facility] ENDOSCOPY;  Service: Endoscopy;  Laterality: N/A;   COLONOSCOPY WITH PROPOFOL  N/A 06/30/2020   Procedure: COLONOSCOPY  WITH PROPOFOL ;  Surgeon: Toledo, Ladell POUR, MD;  Location: [facility] ENDOSCOPY;  Service: Gastroenterology;  Laterality: N/A;   HARDWARE REMOVAL Right 05/12/2019   Procedure: HARDWARE REMOVAL RIGHT ANKLE;  Surgeon: Kendal Franky SQUIBB, MD;  Location: [facility] OR;  Service: Orthopedics;  Laterality: Right;  Abductor Canal and Popliteal Blocks per Anesthesia   HERNIA REPAIR Bilateral    Inguinal   ORIF ANKLE FRACTURE Right 10/03/2018   Procedure: OPEN REDUCTION INTERNAL FIXATION (ORIF) ANKLE FRACTURE;  Surgeon: Kendal Franky SQUIBB, MD;  Location: [facility] OR;  Service: Orthopedics;  Laterality: Right;   UVULOPLASTY N/A 10/24/2017   Procedure: COLEEN;  UvuloplastySurgeon: Milissa Hamming, MD;  Location: [facility];  Service: ENT;  Laterality: N/A;    Social History Social History   Tobacco Use   Smoking status: Former    Types: Cigarettes   Smokeless tobacco: Former    Types: Chew, Snuff    Quit date: 04/2023   Tobacco comments:    quit over 30 yrs ago  Vaping Use   Vaping status: Never Used  Substance Use Topics   Alcohol use: No    Comment: may drink 2x/year   Drug use: No    Family History History reviewed. No pertinent family history.  No Known Allergies   REVIEW OF SYSTEMS (  Negative unless checked)  Constitutional: [] Weight loss  [] Fever  [] Chills Cardiac: [] Chest pain   [] Chest pressure   [] Palpitations   [] Shortness of breath when laying flat   [] Shortness of breath with exertion. Vascular:  [x] Pain in legs with walking   [] Pain in legs at rest  [] History of DVT   [] Phlebitis   [] Swelling in legs   [] Varicose veins   [] Non-healing ulcers Pulmonary:   [] Uses home oxygen   [] Productive cough   [] Hemoptysis   [] Wheeze  [] COPD   [] Asthma Neurologic:  [] Dizziness   [] Seizures   [] History of stroke   [] History of TIA  [] Aphasia   [] Vissual changes   [] Weakness or numbness in arm   [] Weakness or numbness in leg Musculoskeletal:   [] Joint swelling   [] Joint pain   [] Low back  pain Hematologic:  [] Easy bruising  [] Easy bleeding   [] Hypercoagulable state   [] Anemic Gastrointestinal:  [] Diarrhea   [] Vomiting  [] Gastroesophageal reflux/heartburn   [] Difficulty swallowing. Genitourinary:  [] Chronic kidney disease   [] Difficult urination  [] Frequent urination   [] Blood in urine Skin:  [] Rashes   [] Ulcers  Psychological:  [] History of anxiety   []  History of major depression.  Physical Examination  Vitals:   11/23/23 1336  BP: (!) 150/96  Pulse: 76  Weight: 197 lb 3.2 oz (89.4 kg)  Height: 6' 2 (1.88 m)   Body mass index is 25.32 kg/m. Gen: WD/WN, NAD Head: [place]/AT, No temporalis wasting.  Ear/Nose/Throat: Hearing grossly intact, nares w/o erythema or drainage Eyes: PER, EOMI, sclera nonicteric.  Neck: Supple, no masses.  No bruit or JVD.  Pulmonary:  Good air movement, no audible wheezing, no use of accessory muscles.  Cardiac: RRR, normal S1, S2, no Murmurs. Vascular:   Vessel Right Left  Radial Palpable Palpable  Gastrointestinal: soft, non-distended. No guarding/no peritoneal signs.  Musculoskeletal: M/S 5/5 throughout.  No visible deformity.  Neurologic: CN 2-12 intact. Pain and light touch intact in extremities.  Symmetrical.  Speech is fluent. Motor exam as listed above. Psychiatric: Judgment intact, Mood & affect appropriate for pt's clinical situation. Dermatologic: No rashes or ulcers noted.  No changes consistent with cellulitis.   CBC Lab Results  Component Value Date   WBC 19.1 (H) 08/10/2022   WBC 18.8 (H) 08/10/2022   HGB 16.4 08/10/2022   HGB 16.4 08/10/2022   HCT 49.1 08/10/2022   HCT 48.5 08/10/2022   MCV 89.8 08/10/2022   MCV 89.6 08/10/2022   PLT 313 08/10/2022   PLT 325 08/10/2022    BMET    Component Value Date/Time   NA 139 08/10/2022 2138   K 4.0 08/10/2022 2138   CL 101 08/10/2022 2138   CO2 23 08/10/2022 2138   GLUCOSE 160 (H) 08/10/2022 2138   BUN 12 08/10/2022 2138   CREATININE 1.17 08/10/2022 2138   CALCIUM  10.0 08/10/2022 2138   GFRNONAA >60 08/10/2022 2138   GFRAA >60 10/02/2018 0504   CrCl cannot be calculated (Patient's most recent lab result is older than the maximum 21 days allowed.).  COAG No results found for: INR, PROTIME  Radiology CT ANGIO HEAD NECK W WO CM Result Date: 11/22/2023 CLINICAL DATA:  Headache post orgasm EXAM: CT ANGIOGRAPHY HEAD AND NECK WITH AND WITHOUT CONTRAST TECHNIQUE: Multidetector CT imaging of the head and neck was performed using the standard protocol during bolus administration of intravenous contrast. Multiplanar CT image reconstructions and MIPs were obtained to evaluate the vascular anatomy. Carotid stenosis measurements (when applicable) are obtained  utilizing NASCET criteria, using the distal internal carotid diameter as the denominator. RADIATION DOSE REDUCTION: This exam was performed according to the departmental dose-optimization program which includes automated exposure control, adjustment of the mA and/or kV according to patient size and/or use of iterative reconstruction technique. CONTRAST:  75mL OMNIPAQUE  IOHEXOL  350 MG/ML SOLN COMPARISON:  None Available. FINDINGS: CT HEAD FINDINGS Brain: No evidence of acute infarction, hemorrhage, hydrocephalus, extra-axial collection or mass lesion/mass effect. Vascular: See below. Skull: No acute fracture. Sinuses/Orbits: Clear sinuses.  No acute orbital findings. Other: No mastoid effusions. Review of the MIP images confirms the above findings CTA NECK FINDINGS Aortic arch: Great vessel origins are patent without significant stenosis Right carotid system: No evidence of dissection, stenosis (50% or greater), or occlusion. Left carotid system: No evidence of dissection, stenosis (50% or greater), or occlusion. Vertebral arteries: Right dominant. Moderate narrowing of the proximal right vertebral artery with abrupt caliber change/dilation of the right V2 vertebral artery. Left vertebral artery is patent without  significant stenosis. Skeleton: No acute abnormality on limited assessment. Other neck: No acute abnormality on limited assessment. Upper chest: The lung apices are clear. Review of the MIP images confirms the above findings CTA HEAD FINDINGS Anterior circulation: Bilateral intracranial ICAs, MCAs, and ACAs are patent without proximal hemodynamically significant stenosis. No aneurysm identified. Posterior circulation: Bilateral intradural vertebral arteries, basilar artery and bilateral posterior cerebral arteries are patent. The mild-to-moderate basilar artery stenosis. Left fetal type PCA, anatomic variant. No aneurysm identified. Venous sinuses: As permitted by contrast timing, patent. Review of the MIP images confirms the above findings IMPRESSION: 1. Moderate narrowing of the proximal right vertebral artery with abrupt caliber change/dilation of the right V2 vertebral artery, which could be due to age-indeterminate dissection or atherosclerosis. Recommend correlation with the presence or absence of right-sided neck pain. 2. Otherwise, no large vessel occlusion or proximal hemodynamically significant stenosis. 3. No aneurysm identified. 4. No evidence of acute intracranial abnormality. Electronically Signed   By: Gilmore GORMAN Molt M.D.   On: 11/22/2023 13:32     Assessment/Plan 1. Dissection of vertebral artery (Primary) Given the patient's acute onset of the neck pain and headache this is more likely acute.  I will start him on Plavix  and aspirin  81 mg.  I have spoken directly with Dr. Todd Burns neurointerventional list.  I will place a consult so that he may follow-up with Dr. Burns I will also put in an order for the MR. - Ambulatory referral to Neurosurgery - MR ANGIO NECK W WO CONTRAST; Future - MR ANGIO HEAD WO W CONTRAST; Future  2. Other secondary hypertension I have contacted Dr. Cleotilde and asked for his assistance in obtaining blood pressure control.     Cordella Shawl,  MD  11/26/2023 6:32 PM

## 2023-11-26 NOTE — Telephone Encounter (Signed)
 The phone call went straight to voicemail.  I left a brief message.  Dr. Lester did secure chat me I believe I saw that Saturday noting that given his symptoms it is more than likely and acute not chronic dissection he agreed with adding Plavix  and felt that the MRI was not necessarily as important.  When I do get in touch with Derek Ford I will discuss whether he would like to proceed with the MRI or not certainly if his symptoms do not seem to be improving then may be worthwhile to proceed.
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
## 2023-11-27 ENCOUNTER — Telehealth (INDEPENDENT_AMBULATORY_CARE_PROVIDER_SITE_OTHER): Payer: Self-pay

## 2023-11-27 NOTE — Telephone Encounter (Signed)
 Call to patient as he left msg that he was trying to reach the office after call from Provider, unable to reach got voicemail left msg for him that we are trying to return his call with office contact information.    Dois Seip CMA

## 2023-11-27 NOTE — Telephone Encounter (Signed)
 Call back in attempt to reach patient he advises he received my message this morning,called back to the office and he has had everything taken care of no further questions or needs at this time.   Dois Seip CMA

## 2023-12-01 ENCOUNTER — Ambulatory Visit
Admission: RE | Admit: 2023-12-01 | Discharge: 2023-12-01 | Disposition: A | Discharge status: Home / Self Care | Source: Ambulatory Visit | Attending: Vascular Surgery | Admitting: Vascular Surgery

## 2023-12-01 DIAGNOSIS — I7774 Dissection of vertebral artery: Secondary | ICD-10-CM

## 2023-12-01 MED ORDER — GADOBUTROL 1 MMOL/ML IV SOLN
9.0000 mL | Freq: Once | INTRAVENOUS | Status: AC | PRN
Start: 2023-12-01 — End: 2023-12-01
  Administered 2023-12-01: 9 mL via INTRAVENOUS

## 2023-12-01 NOTE — Progress Notes (Unsigned)
 MRN : [id number]  Derek Ford is a 58 y.o. (1965-09-07) male who presents with chief complaint of check carotid arteries.  History of Present Illness:   The patient is seen for follow up evaluation of carotid dissection status post MR angiogram.  MRA scan was done 12/01/2023.  Patient reports that the test went well with no problems or complications.   The patient denies interval amaurosis fugax. There is no recent or interval TIA symptoms or focal motor deficits. There is no prior documented CVA.  The patient is taking enteric-coated aspirin  81 mg daily.  There is no history of migraine headaches. There is no history of seizures.  No recent shortening of the patient's walking distance or new symptoms consistent with claudication.  No history of rest pain symptoms. No new ulcers or wounds of the lower extremities have occurred.  There is no history of DVT, PE or superficial thrombophlebitis. No recent episodes of angina or shortness of breath documented.   MR angiogram is reviewed by me personally and shows *** stenosis consistent with calcified plaque at the origin of the *** internal carotid artery.   No outpatient medications have been marked as taking for the 12/03/23 encounter (Appointment) with Jama, Cordella MATSU, MD.    Past Medical History:  Diagnosis Date   Anxiety    Erectile dysfunction    Hypotestosteronism    Motion sickness    ocean ships   Rosacea    Sleep apnea    CPAP    Past Surgical History:  Procedure Laterality Date   APPENDECTOMY     COLONOSCOPY WITH PROPOFOL  N/A 04/12/2015   Procedure: COLONOSCOPY WITH PROPOFOL ;  Surgeon: Lamar ONEIDA Holmes, MD;  Location[facility] ENDOSCOPY;  Service: Endoscopy;  Laterality: N/A;   COLONOSCOPY WITH PROPOFOL  N/A 06/30/2020   Procedure: COLONOSCOPY WITH PROPOFOL ;  Surgeon: Toledo, Ladell POUR, MD;  Location: [facility] ENDOSCOPY;  Service: Gastroenterology;  Laterality: N/A;   HARDWARE REMOVAL Right  05/12/2019   Procedure: HARDWARE REMOVAL RIGHT ANKLE;  Surgeon: Kendal Franky SQUIBB, MD;  Location: [facility] OR;  Service: Orthopedics;  Laterality: Right;  Abductor Canal and Popliteal Blocks per Anesthesia   HERNIA REPAIR Bilateral    Inguinal   ORIF ANKLE FRACTURE Right 10/03/2018   Procedure: OPEN REDUCTION INTERNAL FIXATION (ORIF) ANKLE FRACTURE;  Surgeon: Kendal Franky SQUIBB, MD;  Location: [facility] OR;  Service: Orthopedics;  Laterality: Right;   UVULOPLASTY N/A 10/24/2017   Procedure: COLEEN;  UvuloplastySurgeon: Milissa Hamming, MD;  Location: [facility];  Service: ENT;  Laterality: N/A;    Social History Social History   Tobacco Use   Smoking status: Former    Types: Cigarettes   Smokeless tobacco: Former    Types: Chew, Snuff    Quit date: 04/2023   Tobacco comments:    quit over 30 yrs ago  Vaping Use   Vaping status: Never Used  Substance Use Topics   Alcohol use: No    Comment: may drink 2x/year   Drug use: No    Family History No family history on file.  No Known Allergies   REVIEW OF SYSTEMS (Negative unless checked)  Constitutional: [] Weight loss  [] Fever  [] Chills Cardiac: [] Chest pain   [] Chest pressure   [] Palpitations   [] Shortness of breath when laying flat   [] Shortness of breath with exertion. Vascular:  [x] Pain in legs with walking   []   Pain in legs at rest  [] History of DVT   [] Phlebitis   [] Swelling in legs   [] Varicose veins   [] Non-healing ulcers Pulmonary:   [] Uses home oxygen   [] Productive cough   [] Hemoptysis   [] Wheeze  [] COPD   [] Asthma Neurologic:  [] Dizziness   [] Seizures   [] History of stroke   [] History of TIA  [] Aphasia   [] Vissual changes   [] Weakness or numbness in arm   [] Weakness or numbness in leg Musculoskeletal:   [] Joint swelling   [] Joint pain   [] Low back pain Hematologic:  [] Easy bruising  [] Easy bleeding   [] Hypercoagulable state   [] Anemic Gastrointestinal:  [] Diarrhea   [] Vomiting  [] Gastroesophageal reflux/heartburn    [] Difficulty swallowing. Genitourinary:  [] Chronic kidney disease   [] Difficult urination  [] Frequent urination   [] Blood in urine Skin:  [] Rashes   [] Ulcers  Psychological:  [] History of anxiety   []  History of major depression.  Physical Examination  There were no vitals filed for this visit. There is no height or weight on file to calculate BMI. Gen: WD/WN, NAD Head: [place]/AT, No temporalis wasting.  Ear/Nose/Throat: Hearing grossly intact, nares w/o erythema or drainage Eyes: PER, EOMI, sclera nonicteric.  Neck: Supple, no masses.  No bruit or JVD.  Pulmonary:  Good air movement, no audible wheezing, no use of accessory muscles.  Cardiac: RRR, normal S1, S2, no Murmurs. Vascular:  carotid bruit noted Vessel Right Left  Radial Palpable Palpable  Carotid  Palpable  Palpable  Subclav  Palpable Palpable  Gastrointestinal: soft, non-distended. No guarding/no peritoneal signs.  Musculoskeletal: M/S 5/5 throughout.  No visible deformity.  Neurologic: CN 2-12 intact. Pain and light touch intact in extremities.  Symmetrical.  Speech is fluent. Motor exam as listed above. Psychiatric: Judgment intact, Mood & affect appropriate for pt's clinical situation. Dermatologic: No rashes or ulcers noted.  No changes consistent with cellulitis.   CBC Lab Results  Component Value Date   WBC 19.1 (H) 08/10/2022   WBC 18.8 (H) 08/10/2022   HGB 16.4 08/10/2022   HGB 16.4 08/10/2022   HCT 49.1 08/10/2022   HCT 48.5 08/10/2022   MCV 89.8 08/10/2022   MCV 89.6 08/10/2022   PLT 313 08/10/2022   PLT 325 08/10/2022    BMET    Component Value Date/Time   NA 139 08/10/2022 2138   K 4.0 08/10/2022 2138   CL 101 08/10/2022 2138   CO2 23 08/10/2022 2138   GLUCOSE 160 (H) 08/10/2022 2138   BUN 12 08/10/2022 2138   CREATININE 1.17 08/10/2022 2138   CALCIUM 10.0 08/10/2022 2138   GFRNONAA >60 08/10/2022 2138   GFRAA >60 10/02/2018 0504   CrCl cannot be calculated (Patient's most recent lab result  is older than the maximum 21 days allowed.).  COAG No results found for: INR, PROTIME  Radiology CT ANGIO HEAD NECK W WO CM Result Date: 11/22/2023 CLINICAL DATA:  Headache post orgasm EXAM: CT ANGIOGRAPHY HEAD AND NECK WITH AND WITHOUT CONTRAST TECHNIQUE: Multidetector CT imaging of the head and neck was performed using the standard protocol during bolus administration of intravenous contrast. Multiplanar CT image reconstructions and MIPs were obtained to evaluate the vascular anatomy. Carotid stenosis measurements (when applicable) are obtained utilizing NASCET criteria, using the distal internal carotid diameter as the denominator. RADIATION DOSE REDUCTION: This exam was performed according to the departmental dose-optimization program which includes automated exposure control, adjustment of the mA and/or kV according to patient size and/or use of iterative reconstruction technique. CONTRAST:  75mL OMNIPAQUE  IOHEXOL  350 MG/ML SOLN COMPARISON:  None Available. FINDINGS: CT HEAD FINDINGS Brain: No evidence of acute infarction, hemorrhage, hydrocephalus, extra-axial collection or mass lesion/mass effect. Vascular: See below. Skull: No acute fracture. Sinuses/Orbits: Clear sinuses.  No acute orbital findings. Other: No mastoid effusions. Review of the MIP images confirms the above findings CTA NECK FINDINGS Aortic arch: Great vessel origins are patent without significant stenosis Right carotid system: No evidence of dissection, stenosis (50% or greater), or occlusion. Left carotid system: No evidence of dissection, stenosis (50% or greater), or occlusion. Vertebral arteries: Right dominant. Moderate narrowing of the proximal right vertebral artery with abrupt caliber change/dilation of the right V2 vertebral artery. Left vertebral artery is patent without significant stenosis. Skeleton: No acute abnormality on limited assessment. Other neck: No acute abnormality on limited assessment. Upper chest: The lung  apices are clear. Review of the MIP images confirms the above findings CTA HEAD FINDINGS Anterior circulation: Bilateral intracranial ICAs, MCAs, and ACAs are patent without proximal hemodynamically significant stenosis. No aneurysm identified. Posterior circulation: Bilateral intradural vertebral arteries, basilar artery and bilateral posterior cerebral arteries are patent. The mild-to-moderate basilar artery stenosis. Left fetal type PCA, anatomic variant. No aneurysm identified. Venous sinuses: As permitted by contrast timing, patent. Review of the MIP images confirms the above findings IMPRESSION: 1. Moderate narrowing of the proximal right vertebral artery with abrupt caliber change/dilation of the right V2 vertebral artery, which could be due to age-indeterminate dissection or atherosclerosis. Recommend correlation with the presence or absence of right-sided neck pain. 2. Otherwise, no large vessel occlusion or proximal hemodynamically significant stenosis. 3. No aneurysm identified. 4. No evidence of acute intracranial abnormality. Electronically Signed   By: Gilmore GORMAN Molt M.D.   On: 11/22/2023 13:32     Assessment/Plan There are no diagnoses linked to this encounter.   Cordella Shawl, MD  12/01/2023 4:51 PM

## 2023-12-03 ENCOUNTER — Ambulatory Visit (INDEPENDENT_AMBULATORY_CARE_PROVIDER_SITE_OTHER): Admitting: Vascular Surgery

## 2023-12-03 ENCOUNTER — Encounter (INDEPENDENT_AMBULATORY_CARE_PROVIDER_SITE_OTHER): Payer: Self-pay | Admitting: Vascular Surgery

## 2023-12-03 VITALS — BP 165/98 | HR 60 | Ht 74.0 in | Wt 195.4 lb

## 2023-12-03 DIAGNOSIS — I7774 Dissection of vertebral artery: Secondary | ICD-10-CM | POA: Diagnosis not present

## 2023-12-03 DIAGNOSIS — N390 Urinary tract infection, site not specified: Secondary | ICD-10-CM | POA: Insufficient documentation

## 2023-12-03 DIAGNOSIS — K1379 Other lesions of oral mucosa: Secondary | ICD-10-CM | POA: Insufficient documentation

## 2023-12-03 DIAGNOSIS — I158 Other secondary hypertension: Secondary | ICD-10-CM | POA: Diagnosis not present

## 2023-12-04 ENCOUNTER — Encounter (INDEPENDENT_AMBULATORY_CARE_PROVIDER_SITE_OTHER): Payer: Self-pay | Admitting: Vascular Surgery

## 2023-12-06 ENCOUNTER — Ambulatory Visit (INDEPENDENT_AMBULATORY_CARE_PROVIDER_SITE_OTHER): Admitting: Neuroradiology

## 2023-12-06 ENCOUNTER — Encounter: Payer: Self-pay | Admitting: Neuroradiology

## 2023-12-06 VITALS — BP 141/92 | HR 64 | Ht 73.0 in | Wt 199.0 lb

## 2023-12-06 DIAGNOSIS — G4453 Primary thunderclap headache: Secondary | ICD-10-CM

## 2023-12-06 DIAGNOSIS — I7774 Dissection of vertebral artery: Secondary | ICD-10-CM | POA: Diagnosis not present

## 2023-12-06 DIAGNOSIS — I671 Cerebral aneurysm, nonruptured: Secondary | ICD-10-CM

## 2023-12-06 NOTE — Progress Notes (Signed)
 I had the pleasure of meeting with Derek Ford in the office today.  Briefly, last Saturday he had a thunderclap headache with orgasm.  He did not notice any neck pain at the time.  He had a CT and CTA on 11/22/2023 and an MRA on 12/01/2023.  I reviewed all of those studies.  Briefly, the CT showed no subarachnoid hemorrhage.  This was in close proximity to his symptoms and therefore should have a very high sensitivity.  The CTA unexpectedly demonstrated a narrow segment of the cervical right vertebral artery suspicious for dissection, which was subsequently confirmed on the MRA as there is an intramural hematoma visible.  He does not really recall any clinical event that could have led to the dissection.  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  He has not had any symptoms of stroke.  He has been treated with aspirin  and clopidogrel .  I reviewed all of the above imaging studies.  Assessment:  1.  Thunderclap headache with orgasm 2.  Right vertebral artery dissection 3.  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  As the syndrome of thunderclap headache with orgasm is relatively common, it is difficult to know whether the headache and vertebral artery dissection are related.  The dissection could possibly be an incidental finding as his symptoms do not really suggest pain from a dissection.  Recommendation:  1.  Complete 6 months of aspirin  and clopidogrel  for the dissection 2.  Repeat CTA of the head and neck in 6 months to reevaluate the dissection and the possible aneurysm 3.  I have told him if he experiences another thunderclap headache it is worth repeating the head CT and he therefore should seek immediate medical attention.  I have explained that unfortunately the orgasmic thunderclap headache can occur multiple times, and if this becomes a more chronic problem he certainly does not need to go to the  emergency room each time.  However, if it does happen again, I would feel more comfortable if he verifies that there is no hemorrhage at least 1 more time. 4.  I will follow-up with him at 6 months.  I spent more than 45 minutes with the review of the medical history, imaging studies and interview with the patient.

## 2023-12-08 ENCOUNTER — Emergency Department

## 2023-12-08 ENCOUNTER — Emergency Department
Admission: EM | Admit: 2023-12-08 | Discharge: 2023-12-08 | Disposition: A | Discharge status: Home / Self Care | Attending: Emergency Medicine | Admitting: Emergency Medicine

## 2023-12-08 DIAGNOSIS — I7774 Dissection of vertebral artery: Secondary | ICD-10-CM

## 2023-12-08 DIAGNOSIS — I160 Hypertensive urgency: Secondary | ICD-10-CM

## 2023-12-08 DIAGNOSIS — I10 Essential (primary) hypertension: Secondary | ICD-10-CM | POA: Diagnosis not present

## 2023-12-08 DIAGNOSIS — E871 Hypo-osmolality and hyponatremia: Secondary | ICD-10-CM | POA: Insufficient documentation

## 2023-12-08 DIAGNOSIS — R200 Anesthesia of skin: Secondary | ICD-10-CM | POA: Diagnosis present

## 2023-12-08 DIAGNOSIS — R202 Paresthesia of skin: Secondary | ICD-10-CM | POA: Diagnosis not present

## 2023-12-08 LAB — COMPREHENSIVE METABOLIC PANEL WITH GFR
ALT: 26 U/L (ref 0–44)
AST: 24 U/L (ref 15–41)
Albumin: 4.2 g/dL (ref 3.5–5.0)
Alkaline Phosphatase: 48 U/L (ref 38–126)
Anion gap: 12 (ref 5–15)
BUN: 10 mg/dL (ref 6–20)
CO2: 27 mmol/L (ref 22–32)
Calcium: 9.5 mg/dL (ref 8.9–10.3)
Chloride: 90 mmol/L — ABNORMAL LOW (ref 98–111)
Creatinine, Ser: 0.91 mg/dL (ref 0.61–1.24)
GFR, Estimated: >60 mL/min (ref >60)
Glucose, Bld: 108 mg/dL — ABNORMAL HIGH (ref 70–99)
Potassium: 4.8 mmol/L (ref 3.5–5.1)
Sodium: 129 mmol/L — ABNORMAL LOW (ref 135–145)
Total Bilirubin: 1.7 mg/dL — ABNORMAL HIGH (ref 0.0–1.2)
Total Protein: 7.5 g/dL (ref 6.5–8.1)

## 2023-12-08 LAB — CBC
HCT: 43.7 % (ref 39.0–52.0)
Hemoglobin: 15.1 g/dL (ref 13.0–17.0)
MCH: 30.3 pg (ref 26.0–34.0)
MCHC: 34.6 g/dL (ref 30.0–36.0)
MCV: 87.6 fL (ref 80.0–100.0)
Platelets: 283 K/uL (ref 150–400)
RBC: 4.99 MIL/uL (ref 4.22–5.81)
RDW: 11.9 % (ref 11.5–15.5)
WBC: 7.2 K/uL (ref 4.0–10.5)
nRBC: 0 % (ref 0.0–0.2)

## 2023-12-08 LAB — DIFFERENTIAL
Abs Immature Granulocytes: 0.02 K/uL (ref 0.00–0.07)
Basophils Absolute: 0.1 K/uL (ref 0.0–0.1)
Basophils Relative: 1 %
Eosinophils Absolute: 0.1 K/uL (ref 0.0–0.5)
Eosinophils Relative: 2 %
Immature Granulocytes: 0 %
Lymphocytes Relative: 32 %
Lymphs Abs: 2.3 K/uL (ref 0.7–4.0)
Monocytes Absolute: 0.6 K/uL (ref 0.1–1.0)
Monocytes Relative: 8 %
Neutro Abs: 4.2 K/uL (ref 1.7–7.7)
Neutrophils Relative %: 57 %

## 2023-12-08 LAB — APTT: aPTT: 28 s (ref 24–36)

## 2023-12-08 LAB — PROTIME-INR
INR: 0.9 (ref 0.8–1.2)
Prothrombin Time: 12.6 s (ref 11.4–15.2)

## 2023-12-08 LAB — ETHANOL: Alcohol, Ethyl (B): <15 mg/dL (ref <15)

## 2023-12-08 LAB — CBG MONITORING, ED: Glucose-Capillary: 113 mg/dL — ABNORMAL HIGH (ref 70–99)

## 2023-12-08 MED ORDER — SODIUM CHLORIDE 0.9% FLUSH
3.0000 mL | Freq: Once | INTRAVENOUS | Status: AC
  Administered 2023-12-08: 3 mL via INTRAVENOUS

## 2023-12-08 MED ORDER — LABETALOL HCL 5 MG/ML IV SOLN
10.0000 mg | Freq: Once | INTRAVENOUS | Status: AC
  Administered 2023-12-08: 10 mg via INTRAVENOUS
  Filled 2023-12-08: qty 4

## 2023-12-08 NOTE — ED Notes (Signed)
 Pt to MRI

## 2023-12-08 NOTE — ED Notes (Signed)
 Pt to CT 1224

## 2023-12-08 NOTE — ED Notes (Signed)
 LKW [id number]

## 2023-12-08 NOTE — Consult Note (Signed)
 NEUROLOGY CONSULT NOTE   Date of service: December 08, 2023 Patient Name: Derek Ford MRN:  [id number] DOB:  November 15, 1965 Chief Complaint: right facial numbness of sudden onset Requesting Provider: Arlander Charleston, MD  History of Present Illness  SHARONE ALMOND is a 58 y.o. male with hx of recently diagnosed right vertebral artery dissection, when he had head imaging done for a thunderclap headache-with imaging negative for bleed but CTA on 11/22/2023 and MRA on 12/01/2023 demonstrating right vertebral artery dissection.  Started on dual antiplatelets, following with neurointerventional radiology-Dr. Lester.  Last known well 11:45 AM when he had sudden onset of numbness that went from his right face to the right back of his head and neck.  He was given instructions on when to seek medical attention and sudden onset of focal neurological deficits was a red flag that brought him to the emergency department where a code stroke was activated.  Systolic blood pressure noted to be in the 190s in the ED.  Denies visual symptoms.  Reports a mild head and neck pain on the right side which is worse today. Denies any visual symptoms.  Denies any arm or leg weakness or numbness.  Denies any chest pain shortness of breath.  Denies nausea vomiting.  Denies any recent sick exposures.  LKW: 11:45 AM. Modified rankin score: 0-Completely asymptomatic and back to baseline post- stroke IV Thrombolysis: No-too mild to treat EVT: No-exam not consistent with LVO NIH stroke scale-1 for sensory  ROS  Comprehensive ROS performed and pertinent positives documented in HPI   Past History   Past Medical History:  Diagnosis Date   Anxiety    Erectile dysfunction    Hypotestosteronism    Motion sickness    ocean ships   Rosacea    Sleep apnea    CPAP  Vertebral artery dissection-right  Past Surgical History:  Procedure Laterality Date   APPENDECTOMY     COLONOSCOPY WITH PROPOFOL  N/A 04/12/2015    Procedure: COLONOSCOPY WITH PROPOFOL ;  Surgeon: Charleston ONEIDA Holmes, MD;  Location: [facility] ENDOSCOPY;  Service: Endoscopy;  Laterality: N/A;   COLONOSCOPY WITH PROPOFOL  N/A 06/30/2020   Procedure: COLONOSCOPY WITH PROPOFOL ;  Surgeon: Toledo, Ladell POUR, MD;  Location: [facility] ENDOSCOPY;  Service: Gastroenterology;  Laterality: N/A;   HARDWARE REMOVAL Right 05/12/2019   Procedure: HARDWARE REMOVAL RIGHT ANKLE;  Surgeon: Kendal Franky SQUIBB, MD;  Location: [facility] OR;  Service: Orthopedics;  Laterality: Right;  Abductor Canal and Popliteal Blocks per Anesthesia   HERNIA REPAIR Bilateral    Inguinal   ORIF ANKLE FRACTURE Right 10/03/2018   Procedure: OPEN REDUCTION INTERNAL FIXATION (ORIF) ANKLE FRACTURE;  Surgeon: Kendal Franky SQUIBB, MD;  Location: [facility] OR;  Service: Orthopedics;  Laterality: Right;   UVULOPLASTY N/A 10/24/2017   Procedure: COLEEN;  UvuloplastySurgeon: Milissa Hamming, MD;  Location: [facility];  Service: ENT;  Laterality: N/A;    Family History: No family history on file.  Social History  reports that he has quit smoking. His smoking use included cigarettes. He quit smokeless tobacco use about 8 months ago.  His smokeless tobacco use included chew and snuff. He reports that he does not drink alcohol and does not use drugs.  No Known Allergies  Medications   Current Facility-Administered Medications:    sodium chloride  flush (NS) 0.9 % injection 3 mL, 3 mL, Intravenous, Once, Arlander Charleston, MD  Current Outpatient Medications:    acetaminophen  (TYLENOL ) 500 MG tablet, Take 1,000 mg by mouth every 8 (eight) hours as  needed for moderate pain (pain score 4-6)., Disp: , Rfl:    aspirin  EC 81 MG tablet, Take 1 tablet (81 mg total) by mouth daily., Disp: 150 tablet, Rfl: 2   AUVELITY 45-105 MG TBCR, Take 1 tablet by mouth 2 (two) times daily., Disp: , Rfl:    clopidogrel  (PLAVIX ) 75 MG tablet, Take 1 tablet (75 mg total) by mouth daily., Disp: 30 tablet, Rfl: 5   EPINEPHrine  0.3 mg/0.3 mL  IJ SOAJ injection, Inject 0.3 mg into the muscle as needed for anaphylaxis., Disp: , Rfl:    fluticasone (FLONASE) 50 MCG/ACT nasal spray, Place 1 spray into both nostrils daily., Disp: , Rfl:    ibuprofen  (ADVIL ) 200 MG tablet, Take 800 mg by mouth., Disp: , Rfl:    loratadine-pseudoephedrine (CLARITIN-D 24-HOUR) 10-240 MG 24 hr tablet, Take 1 tablet by mouth daily as needed for allergies (congestion). , Disp: , Rfl:    metoprolol  succinate (TOPROL -XL) 50 MG 24 hr tablet, Take 50 mg by mouth daily., Disp: , Rfl:    montelukast (SINGULAIR) 10 MG tablet, Take 10 mg by mouth daily., Disp: , Rfl:    pravastatin (PRAVACHOL) 20 MG tablet, Take 20 mg by mouth at bedtime., Disp: , Rfl:    sildenafil (REVATIO) 20 MG tablet, Take 20 mg by mouth daily as needed. Prior to sex, Disp: , Rfl:   Vitals   Vitals:   2024/01/04 1219  BP: (!) 196/112  Pulse: (!) 58  Resp: 20  SpO2: 100%    There is no height or weight on file to calculate BMI.   Physical Exam  General: Well-developed well-nourished man in no acute distress HEENT: Normocephalic atraumatic Lungs are clear Cardiovascular: Regular rate rhythm Abdomen nondistended nontender Neurological exam He is awake alert oriented x 3 There is no evidence of dysarthria No evidence of aphasia Cranial nerves II to XII-pupils equal round react light, extraocular movements intact, visual fields full, facial sensation diminished on the right face in comparison to the left, face is symmetric, auditory acuity intact, shoulder shrug intact, tongue and palate midline. Motor examination reveals no drift in any of the 4 extremities Sensation intact to light touch without extinction Coordination examination reveals no dysmetria.  Labs/Imaging/Neurodiagnostic studies   CBC:  Recent Labs  Lab 01/04/24 1229  WBC 7.2  NEUTROABS 4.2  HGB 15.1  HCT 43.7  MCV 87.6  PLT 283   Basic Metabolic Panel:  Lab Results  Component Value Date   NA 139 08/10/2022    K 4.0 08/10/2022   CO2 23 08/10/2022   GLUCOSE 160 (H) 08/10/2022   BUN 12 08/10/2022   CREATININE 1.17 08/10/2022   CALCIUM 10.0 08/10/2022   GFRNONAA >60 08/10/2022   GFRAA >60 10/02/2018   Alcohol Level     Component Value Date/Time   ETH <10 10/01/2018 1303   ASSESSMENT   INDY KUCK is a 58 y.o. male with past medical history of recent right vertebral artery dissection, sleep apnea-currently on DAPT, presenting for evaluation of sudden onset of right face and neck numbness that started suddenly around 11:45 AM. NIH stroke scale is 1 for the sensory symptoms No other abnormalities noted on neurological exam Noncontrast head CT negative for bleed or acute process. At this time, symptoms might be related to a small stroke (thalamic lacunar or brainstem or small embolic event) given the recent vertebral artery dissection or may be related to extremely high blood pressures that he presented with.  An MRI would be helpful  to know whether he has had an actual ischemic event or not and may guide treatment.  Discussed risks benefits and alternatives of IV TNKase and given his low NIH stroke scale as well as the fact that this might be paresthesias related to hypertensive emergency in the setting of acute vertebral artery dissection -advised against thrombolysis at this time as benefits do not outweigh risks.  Impression: Right face and neck paresthesias in the setting of known right vertebral artery dissection-evaluate for stroke  RECOMMENDATIONS  At this time, I would recommend getting stat MRI of the brain. As for the blood pressure goals, his initial blood pressure was systolic 190 but that has come down to 171 without any treatment.  I would recommend keeping the blood pressure below 180 for now. Once the MRI is completed, I will provide further recommendations This plan was relayed to Dr. Arlander in person.  Addendum Stat MRI of the brain completed Reviewed-negative for  bleed. Symptoms likely related to hypertensive urgency. Gradually bring blood pressure down as you would for hypertensive urgency Continue DAPT. Follow with the neuroendovascular hospitalist the plan ______________________________________________________________________    Signed, Eligio Lav, MD [facility]

## 2023-12-08 NOTE — Progress Notes (Signed)
   12/08/23 1200  Spiritual Encounters  Type of Visit Initial  Care provided to: Family  Referral source Code page  Reason for visit Code  OnCall Visit Yes   Chaplain responded to Code Stroke. Patient at CT, wife and daughter of patient in room. Daughter distraught, being comforted by her mom. Chaplain provided drink and tissue and compassionate presence.

## 2023-12-08 NOTE — ED Notes (Signed)
 Neurologist at bedside.

## 2023-12-08 NOTE — ED Triage Notes (Signed)
 Pt to ED POV forR facial numbness that started at 1155 (20 minute sago). Speech is clear. No motor weakness. Hx ventral arterial dissection. Takes Plavix  and aspirin . Gait is steady. Calling code stroke.

## 2023-12-08 NOTE — Discharge Instructions (Addendum)
 Please discuss with Dr. Cleotilde whether your blood pressure medications could be affecting your sodium levels

## 2023-12-08 NOTE — ED Notes (Signed)
 Pt back to room from CT

## 2023-12-08 NOTE — Code Documentation (Addendum)
 CODE STROKE- PHARMACY COMMUNICATION   Time CODE STROKE called/page received:1224  Time response to CODE STROKE was made (in person): 1229  Time Stroke Kit retrieved from Pyxis: not required  Name of Provider/Nurse contacted: Voncile, MD  Past Medical History:  Diagnosis Date   Anxiety    Arterial dissection    ventral arterial dissection   Erectile dysfunction    Hypotestosteronism    Motion sickness    ocean ships   Rosacea    Sleep apnea    CPAP   Prior to Admission medications   Medication Sig Start Date End Date Taking? Authorizing Provider  acetaminophen  (TYLENOL ) 500 MG tablet Take 1,000 mg by mouth every 8 (eight) hours as needed for moderate pain (pain score 4-6).    [provider]  aspirin  EC 81 MG tablet Take 1 tablet (81 mg total) by mouth daily. 11/23/23   Schnier, Cordella MATSU, MD  AUVELITY 45-105 MG TBCR Take 1 tablet by mouth 2 (two) times daily. 11/26/23   [provider]  clopidogrel  (PLAVIX ) 75 MG tablet Take 1 tablet (75 mg total) by mouth daily. 11/23/23   Schnier, Cordella MATSU, MD  EPINEPHrine  0.3 mg/0.3 mL IJ SOAJ injection Inject 0.3 mg into the muscle as needed for anaphylaxis. 10/17/23 10/16/24  [provider]  fluticasone (FLONASE) 50 MCG/ACT nasal spray Place 1 spray into both nostrils daily.    [provider]  ibuprofen  (ADVIL ) 200 MG tablet Take 800 mg by mouth.    [provider]  loratadine-pseudoephedrine (CLARITIN-D 24-HOUR) 10-240 MG 24 hr tablet Take 1 tablet by mouth daily as needed for allergies (congestion).     [provider]  metoprolol  succinate (TOPROL -XL) 50 MG 24 hr tablet Take 50 mg by mouth daily. 11/23/23 11/22/24  [provider]  montelukast (SINGULAIR) 10 MG tablet Take 10 mg by mouth daily. 11/27/23   [provider]  pravastatin (PRAVACHOL) 20 MG tablet Take 20 mg by mouth at bedtime. 06/12/23 06/11/24  [provider]  sildenafil (REVATIO) 20 MG tablet Take 20 mg  by mouth daily as needed. Prior to sex    [provider]    Adriana JONETTA Bolster ,PharmD Clinical Pharmacist  12/08/2023  12:27 PM

## 2023-12-08 NOTE — ED Provider Notes (Signed)
 [facility] Provider Note    Event Date/Time   First MD Initiated Contact with Patient 12/08/23 1221     (approximate)   History   Numbness   HPI  Derek Ford is a 58 y.o. male recently diagnosed with right vertebral artery dissection, has been doing well, has been taking new medications for blood pressure control, was not feeling overly well this morning attributed to blood pressure medication but about an hour ago developed numbness in the right face.  No weakness reported.  Patient's wife is also providing history     Physical Exam   Triage Vital Signs: ED Triage Vitals [12/08/23 1219]  Encounter Vitals Group     BP (!) 196/112     Girls Systolic BP Percentile      Girls Diastolic BP Percentile      Boys Systolic BP Percentile      Boys Diastolic BP Percentile      Pulse Rate (!) 58     Resp 20     Temp      Temp src      SpO2 100 %     Weight      Height      Head Circumference      Peak Flow      Pain Score      Pain Loc      Pain Education      Exclude from Growth Chart     Most recent vital signs: Vitals:   12/08/23 1254 12/08/23 1330  BP:  (!) 162/92  Pulse: 64 62  Resp:    SpO2:  100%     General: Awake, no distress.  CV:  Good peripheral perfusion.  Resp:  Normal effort.  Abd:  No distention.  Other:  Normal neurologic exam   ED Results / Procedures / Treatments   Labs (all labs ordered are listed, but only abnormal results are displayed) Labs Reviewed  COMPREHENSIVE METABOLIC PANEL WITH GFR - Abnormal; Notable for the following components:      Result Value   Sodium 129 (*)    Chloride 90 (*)    Glucose, Bld 108 (*)    Total Bilirubin 1.7 (*)    All other components within normal limits  CBG MONITORING, ED - Abnormal; Notable for the following components:   Glucose-Capillary 113 (*)    All other components within normal limits  PROTIME-INR  APTT  CBC  DIFFERENTIAL  ETHANOL  I-STAT CREATININE,  ED  CBG MONITORING, ED     EKG     RADIOLOGY MRI brain is without acute abnormality    PROCEDURES:  Critical Care performed:   Procedures   MEDICATIONS ORDERED IN ED: Medications  sodium chloride  flush (NS) 0.9 % injection 3 mL (3 mLs Intravenous Given 12/08/23 1253)  labetalol (NORMODYNE) injection 10 mg (10 mg Intravenous Given 12/08/23 1332)     IMPRESSION / MDM / ASSESSMENT AND PLAN / ED COURSE  I reviewed the triage vital signs and the nursing notes. Patient's presentation is most consistent with acute presentation with potential threat to life or bodily function.  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 Deedra of neurology who recommends MRI given normal CT scan  IV labetalol given for elevated blood pressure with significant improvement  MRI is negative, discussed Dr. Deedra who recommends  discharge with outpatient follow-up  Mild hyponatremia on labs, patient will follow-up with PCP for recheck on the ninth      FINAL CLINICAL IMPRESSION(S) / ED DIAGNOSES   Final diagnoses:  Paresthesia  Hyponatremia     Rx / DC Orders   ED Discharge Orders     None        Note:  This document was prepared using Dragon voice recognition software and may include unintentional dictation errors.   Arlander Charleston, MD 12/08/23 [phone]

## 2023-12-08 NOTE — ED Notes (Signed)
 Code stroke called at 12:21

## 2023-12-17 ENCOUNTER — Ambulatory Visit (INDEPENDENT_AMBULATORY_CARE_PROVIDER_SITE_OTHER): Admitting: Vascular Surgery

## 2023-12-17 ENCOUNTER — Encounter (INDEPENDENT_AMBULATORY_CARE_PROVIDER_SITE_OTHER): Payer: Self-pay | Admitting: Vascular Surgery

## 2023-12-17 VITALS — BP 169/99 | HR 69 | Resp 18 | Ht 73.0 in | Wt 199.6 lb

## 2023-12-17 DIAGNOSIS — E782 Mixed hyperlipidemia: Secondary | ICD-10-CM | POA: Diagnosis not present

## 2023-12-17 DIAGNOSIS — I158 Other secondary hypertension: Secondary | ICD-10-CM

## 2023-12-17 DIAGNOSIS — I7774 Dissection of vertebral artery: Secondary | ICD-10-CM | POA: Diagnosis not present

## 2023-12-17 DIAGNOSIS — M150 Primary generalized (osteo)arthritis: Secondary | ICD-10-CM | POA: Diagnosis not present

## 2023-12-25 ENCOUNTER — Encounter (INDEPENDENT_AMBULATORY_CARE_PROVIDER_SITE_OTHER): Payer: Self-pay | Admitting: Vascular Surgery

## 2023-12-25 DIAGNOSIS — M1990 Unspecified osteoarthritis, unspecified site: Secondary | ICD-10-CM | POA: Insufficient documentation

## 2023-12-25 NOTE — Progress Notes (Signed)
 MRN : [id number]  CASSEY Ford is a 58 y.o. ([birth year]) male who presents with chief complaint of check carotid arteries.  History of Present Illness:   Patient returns to the office for follow-up regarding right vertebral artery dissection.  He has continued to have issues with his blood pressure which then causes symptoms.  Most recently he was seen in the emergency room on December 08, 2023 by Dr. Arlander for numbness of the face.  This was associated with a marked increase in his blood pressure and his symptoms resolved with IV labetalol.  He has since been monitoring his blood pressure at home and using the labetalol to mitigate symptoms if they occur.  Imaging done during the October 4 ER visit did not suggest any propagation.  He has also been seen by Dr. Lester who agreed with his antiplatelet therapy and did not feel that intervention would be appropriate at this time.  Current Meds  Medication Sig   acetaminophen  (TYLENOL ) 500 MG tablet Take 1,000 mg by mouth every 8 (eight) hours as needed for moderate pain (pain score 4-6).   aspirin  EC 81 MG tablet Take 1 tablet (81 mg total) by mouth daily.   AUVELITY 45-105 MG TBCR Take 1 tablet by mouth 2 (two) times daily.   clopidogrel  (PLAVIX ) 75 MG tablet Take 1 tablet (75 mg total) by mouth daily.   EPINEPHrine  0.3 mg/0.3 mL IJ SOAJ injection Inject 0.3 mg into the muscle as needed for anaphylaxis.   fluticasone (FLONASE) 50 MCG/ACT nasal spray Place 1 spray into both nostrils daily.   ibuprofen  (ADVIL ) 200 MG tablet Take 800 mg by mouth.   loratadine-pseudoephedrine (CLARITIN-D 24-HOUR) 10-240 MG 24 hr tablet Take 1 tablet by mouth daily as needed for allergies (congestion).    metoprolol  succinate (TOPROL -XL) 50 MG 24 hr tablet Take 50 mg by mouth daily.   montelukast (SINGULAIR) 10 MG tablet Take 10 mg by mouth daily.   olmesartan-hydrochlorothiazide (BENICAR HCT) 20-12.5 MG tablet Take 1 tablet by mouth  daily.   pravastatin (PRAVACHOL) 20 MG tablet Take 20 mg by mouth at bedtime.   sildenafil (REVATIO) 20 MG tablet Take 20 mg by mouth daily as needed. Prior to sex    Past Medical History:  Diagnosis Date   Anxiety    Erectile dysfunction    Hypotestosteronism    Motion sickness    ocean ships   Rosacea    Sleep apnea    CPAP    Past Surgical History:  Procedure Laterality Date   APPENDECTOMY     COLONOSCOPY WITH PROPOFOL  N/A 04/12/2015   Procedure: COLONOSCOPY WITH PROPOFOL ;  Surgeon: Lamar ONEIDA Holmes, MD;  Location: [facility] ENDOSCOPY;  Service: Endoscopy;  Laterality: N/A;   COLONOSCOPY WITH PROPOFOL  N/A 06/30/2020   Procedure: COLONOSCOPY WITH PROPOFOL ;  Surgeon: Toledo, Ladell POUR, MD;  Location: [facility] ENDOSCOPY;  Service: Gastroenterology;  Laterality: N/A;   HARDWARE REMOVAL Right 05/12/2019   Procedure: HARDWARE REMOVAL RIGHT ANKLE;  Surgeon: Kendal Franky SQUIBB, MD;  Location: [facility] OR;  Service: Orthopedics;  Laterality: Right;  Abductor Canal and Popliteal Blocks per Anesthesia   HERNIA REPAIR Bilateral    Inguinal   ORIF ANKLE FRACTURE Right 10/03/2018   Procedure: OPEN REDUCTION INTERNAL FIXATION (ORIF) ANKLE FRACTURE;  Surgeon: Kendal Franky SQUIBB, MD;  Location: [facility] OR;  Service: Orthopedics;  Laterality: Right;  UVULOPLASTY N/A 10/24/2017   Procedure: COLEEN;  UvuloplastySurgeon: Milissa Hamming, MD;  Location: [facility];  Service: ENT;  Laterality: N/A;    Social History Social History   Tobacco Use   Smoking status: Former    Types: Cigarettes   Smokeless tobacco: Former    Types: Chew, Snuff    Quit date: 04/2023   Tobacco comments:    quit over 30 yrs ago  Vaping Use   Vaping status: Never Used  Substance Use Topics   Alcohol use: No    Comment: may drink 2x/year   Drug use: No    Family History History reviewed. No pertinent family history.  No Known Allergies   REVIEW OF SYSTEMS (Negative unless checked)  Constitutional: [] Weight loss   [] Fever  [] Chills Cardiac: [] Chest pain   [] Chest pressure   [] Palpitations   [] Shortness of breath when laying flat   [] Shortness of breath with exertion. Vascular:  [x] Pain in legs with walking   [] Pain in legs at rest  [] History of DVT   [] Phlebitis   [] Swelling in legs   [] Varicose veins   [] Non-healing ulcers Pulmonary:   [] Uses home oxygen   [] Productive cough   [] Hemoptysis   [] Wheeze  [] COPD   [] Asthma Neurologic:  [] Dizziness   [] Seizures   [] History of stroke   [] History of TIA  [] Aphasia   [] Vissual changes   [] Weakness or numbness in arm   [] Weakness or numbness in leg Musculoskeletal:   [] Joint swelling   [] Joint pain   [] Low back pain Hematologic:  [] Easy bruising  [] Easy bleeding   [] Hypercoagulable state   [] Anemic Gastrointestinal:  [] Diarrhea   [] Vomiting  [] Gastroesophageal reflux/heartburn   [] Difficulty swallowing. Genitourinary:  [] Chronic kidney disease   [] Difficult urination  [] Frequent urination   [] Blood in urine Skin:  [] Rashes   [] Ulcers  Psychological:  [] History of anxiety   []  History of major depression.  Physical Examination  Vitals:   12/17/23 1338  BP: (!) 169/99  Pulse: 69  Resp: 18  Weight: 199 lb 9.6 oz (90.5 kg)  Height: 6' 1 (1.854 m)   Body mass index is 26.33 kg/m. Gen: WD/WN, NAD Head: [place]/AT, No temporalis wasting.  Ear/Nose/Throat: Hearing grossly intact, nares w/o erythema or drainage Eyes: PER, EOMI, sclera nonicteric.  Neck: Supple, no masses.  No bruit or JVD.  Pulmonary:  Good air movement, no audible wheezing, no use of accessory muscles.  Cardiac: RRR, normal S1, S2, no Murmurs. Vascular:  carotid bruit noted Vessel Right Left  Radial Palpable Palpable  Carotid  Palpable  Palpable  Gastrointestinal: soft, non-distended. No guarding/no peritoneal signs.  Musculoskeletal: M/S 5/5 throughout.  No visible deformity.  Neurologic: CN 2-12 intact. Pain and light touch intact in extremities.  Symmetrical.  Speech is fluent. Motor exam  as listed above. Psychiatric: Judgment intact, Mood & affect appropriate for pt's clinical situation. Dermatologic: No rashes or ulcers noted.  No changes consistent with cellulitis.   CBC Lab Results  Component Value Date   WBC 7.2 12/08/2023   HGB 15.1 12/08/2023   HCT 43.7 12/08/2023   MCV 87.6 12/08/2023   PLT 283 12/08/2023    BMET    Component Value Date/Time   NA 129 (L) 12/08/2023 1229   K 4.8 12/08/2023 1229   CL 90 (L) 12/08/2023 1229   CO2 27 12/08/2023 1229   GLUCOSE 108 (H) 12/08/2023 1229   BUN 10 12/08/2023 1229   CREATININE 0.91 12/08/2023 1229   CALCIUM 9.5 12/08/2023  1229   GFRNONAA >60 12/08/2023 1229   GFRAA >60 10/02/2018 0504   Estimated Creatinine Clearance: 98.8 mL/min (by C-G formula based on SCr of 0.91 mg/dL).  COAG Lab Results  Component Value Date   INR 0.9 12/08/2023    Radiology MR BRAIN WO CONTRAST Result Date: 12/08/2023 CLINICAL DATA:  Provided history: Neuro deficit, acute, stroke suspected. Right facial numbness. EXAM: MRI HEAD WITHOUT CONTRAST TECHNIQUE: Multiplanar, multiecho pulse sequences of the brain and surrounding structures were obtained without intravenous contrast. COMPARISON:  Noncontrast head CT 12/08/2023.  MRA head 12/01/2023. FINDINGS: Brain: No age-advanced or lobar predominant cerebral atrophy. Multifocal T2 FLAIR hyperintense signal abnormality within the cerebral white matter, nonspecific but compatible with mild chronic small vessel ischemic disease. Punctate chronic microhemorrhage within the left parietal lobe. No cortical encephalomalacia is identified. There is no acute infarct. No evidence of an intracranial mass. No extra-axial fluid collection. No midline shift. Vascular: Maintained flow voids within the proximal large arterial vessels. Skull and upper cervical spine: No focal worrisome marrow lesion. Sinuses/Orbits: No mass or acute finding within the imaged orbits. Mild mucosal thickening, and small mucous  retention cysts, within the bilateral maxillary sinuses. IMPRESSION: 1. No evidence of an acute intracranial abnormality. 2. Mild chronic small vessel ischemic changes within the cerebral white matter. 3. Mild bilateral maxillary sinus disease. Electronically Signed   By: Rockey Childs D.O.   On: 12/08/2023 14:26   CT HEAD CODE STROKE WO CONTRAST Result Date: 12/08/2023 EXAM: CT HEAD WITHOUT CONTRAST 12/08/2023 12:33:28 PM TECHNIQUE: CT of the head was performed without the administration of intravenous contrast. Automated exposure control, iterative reconstruction, and/or weight based adjustment of the mA/kV was utilized to reduce the radiation dose to as low as reasonably achievable. COMPARISON: CT of the head dated 11/22/2023. CLINICAL HISTORY: Neuro deficit, acute, stroke suspected. LKW ??; Neuro Voncile [id number]. FINDINGS: BRAIN AND VENTRICLES: No acute hemorrhage. No evidence of acute infarct. No hydrocephalus. No extra-axial collection. No mass effect or midline shift. There is age-related atrophy and mild periventricular white matter disease. ORBITS: No acute abnormality. SINUSES: No acute abnormality. SOFT TISSUES AND SKULL: No acute soft tissue abnormality. No skull fracture. IMPRESSION: 1. No acute intracranial abnormality. 2. Age-related atrophy and mild periventricular white matter disease. Electronically signed by: Evalene Coho MD 12/08/2023 12:37 PM EDT RP Workstation: [id number]   MR ANGIO NECK W WO CONTRAST Result Date: 12/01/2023 CLINICAL DATA:  Follow-up examination for right vertebral artery dissection. EXAM: MRA NECK WITHOUT AND WITH CONTRAST MRA HEAD WITHOUT CONTRAST TECHNIQUE: Multiplanar and multiecho pulse sequences of the neck were obtained without and with intravenous contrast. Angiographic images of the neck were obtained using MRA technique without and with intravenous contrast; Angiographic images of the Circle of Willis were obtained using MRA technique without intravenous  contrast. CONTRAST:  9mL GADAVIST GADOBUTROL 1 MMOL/ML IV SOLN COMPARISON:  Comparison made with recent CTA from 11/22/2023. FINDINGS: MRA NECK FINDINGS Aortic arch: Visualized aortic arch within normal limits for caliber with standard 3 vessel morphology. No stenosis or other acute abnormality about the origin the great vessels. Right carotid system: Right common and internal carotid arteries are patent with antegrade flow. No evidence for dissection. No hemodynamically significant stenosis about the right carotid artery system. Left carotid system: Left common and internal carotid arteries are patent with antegrade flow. No evidence for dissection. No hemodynamically significant stenosis about the left carotid artery system. Vertebral arteries: Both vertebral arteries arise from the subclavian arteries. No proximal subclavian artery stenosis. Left  vertebral artery widely patent without stenosis or dissection. On the right, again seen is multifocal irregularity about the proximal right V1 and V2 segments. Increased surrounding T1 signal intensity noted on axial T1 fat-sat sequence (series 11, image 19). Findings consistent with an acute vertebral artery dissection, and a relatively stable as compared to prior CTA. Flow remains preserved. No visible raised dissection flap or filling the defect indicative of thrombus. 2 mm outpouching extending anteriorly from the proximal right V2 segment consistent with a small associated pseudoaneurysm (series 1076, image 13). These changes centrally resolved by the V3 segment, with relatively normal appearance of the right vertebral artery is a courses into the cranial vault. Other: None. MRA HEAD FINDINGS Anterior circulation: Both internal carotid arteries widely patent through the siphons without stenosis or other abnormality. A1 segments patent bilaterally, with the right being dominant. Tiny 2 mm outpouching extending medially and posteriorly from the right aspect of the  anterior communicating artery complex, suspicious for a small aneurysm (series 5, image 99). This is stable from prior CTA in retrospect (series 9, image 219 on that exam). Both ACAs widely patent distally without stenosis. No M1 stenosis or occlusion. No proximal MCA branch occlusion or high-grade stenosis. Distal MCA branches perfused and symmetric. No beading or irregularity. Posterior circulation: Visualized distal V4 segments widely patent without stenosis. Neither PICA origin visualized. Basilar patent without stenosis. Superior cerebral arteries patent bilaterally. Right PCA supplied via the basilar. Fetal type origin of the left PCA. Both PCAs patent to their distal aspects without significant stenosis. No beading or irregularity. Anatomic variants: Fetal type left PCA.  Dominant right A1 segment. Other: None. IMPRESSION: MRA HEAD: 1. 2 mm outpouching extending medially and posteriorly from the right aspect of the anterior communicating artery complex, consistent with a small aneurysm. This is stable from prior CTA in retrospect. 2. Otherwise normal intracranial MRA. No large vessel occlusion. No hemodynamically significant or correctable stenosis. MRA NECK: 1. Persistent findings of arterial dissection involving the proximal right V1 and V2 segments, acute and/or recent in appearance by MRA. 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 Flow remains preserved to the skull base. 2. Otherwise stable and normal MRA of the neck. Electronically Signed   By: Morene Hoard M.D.   On: 12/01/2023 18:51   MR ANGIO HEAD WO CONTRAST Result Date: 12/01/2023 CLINICAL DATA:  Follow-up examination for right vertebral artery dissection. EXAM: MRA NECK WITHOUT AND WITH CONTRAST MRA HEAD WITHOUT CONTRAST TECHNIQUE: Multiplanar and multiecho pulse sequences of the neck were obtained without and with intravenous contrast. Angiographic images of the neck were obtained using MRA  technique without and with intravenous contrast; Angiographic images of the Circle of Willis were obtained using MRA technique without intravenous contrast. CONTRAST:  9mL GADAVIST GADOBUTROL 1 MMOL/ML IV SOLN COMPARISON:  Comparison made with recent CTA from 11/22/2023. FINDINGS: MRA NECK FINDINGS Aortic arch: Visualized aortic arch within normal limits for caliber with standard 3 vessel morphology. No stenosis or other acute abnormality about the origin the great vessels. Right carotid system: Right common and internal carotid arteries are patent with antegrade flow. No evidence for dissection. No hemodynamically significant stenosis about the right carotid artery system. Left carotid system: Left common and internal carotid arteries are patent with antegrade flow. No evidence for dissection. No hemodynamically significant stenosis about the left carotid artery system. Vertebral arteries: Both vertebral arteries arise from the subclavian arteries. No proximal subclavian artery stenosis. Left vertebral artery widely patent  without stenosis or dissection. On the right, again seen is multifocal irregularity about the proximal right V1 and V2 segments. Increased surrounding T1 signal intensity noted on axial T1 fat-sat sequence (series 11, image 19). Findings consistent with an acute vertebral artery dissection, and a relatively stable as compared to prior CTA. Flow remains preserved. No visible raised dissection flap or filling the defect indicative of thrombus. 2 mm outpouching extending anteriorly from the proximal right V2 segment consistent with a small associated pseudoaneurysm (series 1076, image 13). These changes centrally resolved by the V3 segment, with relatively normal appearance of the right vertebral artery is a courses into the cranial vault. Other: None. MRA HEAD FINDINGS Anterior circulation: Both internal carotid arteries widely patent through the siphons without stenosis or other abnormality. A1  segments patent bilaterally, with the right being dominant. Tiny 2 mm outpouching extending medially and posteriorly from the right aspect of the anterior communicating artery complex, suspicious for a small aneurysm (series 5, image 99). This is stable from prior CTA in retrospect (series 9, image 219 on that exam). Both ACAs widely patent distally without stenosis. No M1 stenosis or occlusion. No proximal MCA branch occlusion or high-grade stenosis. Distal MCA branches perfused and symmetric. No beading or irregularity. Posterior circulation: Visualized distal V4 segments widely patent without stenosis. Neither PICA origin visualized. Basilar patent without stenosis. Superior cerebral arteries patent bilaterally. Right PCA supplied via the basilar. Fetal type origin of the left PCA. Both PCAs patent to their distal aspects without significant stenosis. No beading or irregularity. Anatomic variants: Fetal type left PCA.  Dominant right A1 segment. Other: None. IMPRESSION: MRA HEAD: 1. 2 mm outpouching extending medially and posteriorly from the right aspect of the anterior communicating artery complex, consistent with a small aneurysm. This is stable from prior CTA in retrospect. 2. Otherwise normal intracranial MRA. No large vessel occlusion. No hemodynamically significant or correctable stenosis. MRA NECK: 1. Persistent findings of arterial dissection involving the proximal right V1 and V2 segments, acute and/or recent in appearance by MRA. 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 Flow remains preserved to the skull base. 2. Otherwise stable and normal MRA of the neck. Electronically Signed   By: Morene Hoard M.D.   On: 12/01/2023 18:51     Assessment/Plan 1. Vertebral artery dissection (Primary) The dissection appears to be stable.  His labile hypertension is somewhat expected and he is becoming much more adept at adjusting his beta-blockers to the  current situation.  Given that this hypertension is new and relatively acute in onset I feel it necessary to exclude renal artery stenosis as an underlying etiology and will order a duplex ultrasound.  I would not make any changes at this time and continue to monitor consistent with Dr. Wendall recommendations.  - VAS US  RENAL ARTERY DUPLEX; Future  2. Other secondary hypertension Recommend:  Given patient's vertebral artery dissection optimal control of the patient's hypertension is important to prevent long term sequela.  BP is not acceptable today  The patient's vital signs suggest a possibility for renal artery stenosis.  Noninvasive studies are indicated at this time and a renal artery duplex will be ordered.  The patient will follow-up here in the office with the duplex ultrasound.  The patient will continue the current antihypertensive medications, no changes at this time.  The primary medical service will continue aggressive antihypertensive therapy as per the AHA guidelines  - VAS US  RENAL ARTERY DUPLEX; Future  3. Hyperlipidemia, mixed Continue statin as ordered and reviewed, no changes at this time  4. Primary osteoarthritis involving multiple joints Continue medications to treat the patient's degenerative disease as already ordered, these medications have been reviewed and there are no changes at this time.  Continued activity and therapy was stressed.     Cordella Shawl, MD  12/25/2023 2:41 PM

## 2024-01-13 NOTE — Progress Notes (Signed)
 MRN : [id number]  Derek Ford is a 58 y.o. (1965-10-05) male who presents with chief complaint of check circulation.  History of Present Illness:   Patient returns to the office for follow-up regarding right vertebral artery dissection.  He notes his blood pressure has been better but if it does go up which occasionally is still doing he can tell right away.  He also notes that because of the multiple antihypertensives there have been times where he goes low and this causes him to be somewhat dizzy.  Otherwise the pain seems to be largely resolved.  Duplex ultrasound of the renal arteries demonstrates normal renal arteries there is no evidence for renal artery stenosis or other arterial abnormalities   No outpatient medications have been marked as taking for the 01/14/24 encounter (Appointment) with Jama, Cordella MATSU, MD.    Past Medical History:  Diagnosis Date   Anxiety    Erectile dysfunction    Hypotestosteronism    Motion sickness    ocean ships   Rosacea    Sleep apnea    CPAP    Past Surgical History:  Procedure Laterality Date   APPENDECTOMY     COLONOSCOPY WITH PROPOFOL  N/A 04/12/2015   Procedure: COLONOSCOPY WITH PROPOFOL ;  Surgeon: Lamar ONEIDA Holmes, MD;  Location: [facility] ENDOSCOPY;  Service: Endoscopy;  Laterality: N/A;   COLONOSCOPY WITH PROPOFOL  N/A 06/30/2020   Procedure: COLONOSCOPY WITH PROPOFOL ;  Surgeon: Toledo, Ladell POUR, MD;  Location: [facility] ENDOSCOPY;  Service: Gastroenterology;  Laterality: N/A;   HARDWARE REMOVAL Right 05/12/2019   Procedure: HARDWARE REMOVAL RIGHT ANKLE;  Surgeon: Kendal Franky SQUIBB, MD;  Location: [facility] OR;  Service: Orthopedics;  Laterality: Right;  Abductor Canal and Popliteal Blocks per Anesthesia   HERNIA REPAIR Bilateral    Inguinal   ORIF ANKLE FRACTURE Right 10/03/2018   Procedure: OPEN REDUCTION INTERNAL FIXATION (ORIF) ANKLE FRACTURE;  Surgeon: Kendal Franky SQUIBB,  MD;  Location: [facility] OR;  Service: Orthopedics;  Laterality: Right;   UVULOPLASTY N/A 10/24/2017   Procedure: COLEEN;  UvuloplastySurgeon: Milissa Hamming, MD;  Location: [facility];  Service: ENT;  Laterality: N/A;    Social History Social History   Tobacco Use   Smoking status: Former    Types: Cigarettes   Smokeless tobacco: Former    Types: Chew, Snuff    Quit date: 04/2023   Tobacco comments:    quit over 30 yrs ago  Vaping Use   Vaping status: Never Used  Substance Use Topics   Alcohol use: No    Comment: may drink 2x/year   Drug use: No    Family History No family history on file.  No Known Allergies   REVIEW OF SYSTEMS (Negative unless checked)  Constitutional: [] Weight loss  [] Fever  [] Chills Cardiac: [] Chest pain   [] Chest pressure   [] Palpitations   [] Shortness of breath when laying flat   [] Shortness of breath with exertion. Vascular:  [x] Pain in legs with walking   [] Pain in legs at rest  [] History of DVT   [] Phlebitis   [] Swelling in legs   [] Varicose veins   []   Non-healing ulcers Pulmonary:   [] Uses home oxygen   [] Productive cough   [] Hemoptysis   [] Wheeze  [] COPD   [] Asthma Neurologic:  [] Dizziness   [] Seizures   [] History of stroke   [] History of TIA  [] Aphasia   [] Vissual changes   [] Weakness or numbness in arm   [] Weakness or numbness in leg Musculoskeletal:   [] Joint swelling   [] Joint pain   [] Low back pain Hematologic:  [] Easy bruising  [] Easy bleeding   [] Hypercoagulable state   [] Anemic Gastrointestinal:  [] Diarrhea   [] Vomiting  [] Gastroesophageal reflux/heartburn   [] Difficulty swallowing. Genitourinary:  [] Chronic kidney disease   [] Difficult urination  [] Frequent urination   [] Blood in urine Skin:  [] Rashes   [] Ulcers  Psychological:  [] History of anxiety   []  History of major depression.  Physical Examination  There were no vitals filed for this visit. There is no height or weight on file to calculate BMI. Gen: WD/WN, NAD Head:  [place]/AT, No temporalis wasting.  Ear/Nose/Throat: Hearing grossly intact, nares w/o erythema or drainage Eyes: PER, EOMI, sclera nonicteric.  Neck: Supple, no masses.  No bruit or JVD.  Pulmonary:  Good air movement, no audible wheezing, no use of accessory muscles.  Cardiac: RRR, normal S1, S2, no Murmurs. Vascular:   Vessel Right Left  Radial Palpable Palpable  Gastrointestinal: soft, non-distended. No guarding/no peritoneal signs.  Musculoskeletal: M/S 5/5 throughout.  No visible deformity.  Neurologic: CN 2-12 intact. Pain and light touch intact in extremities.  Symmetrical.  Speech is fluent. Motor exam as listed above. Psychiatric: Judgment intact, Mood & affect appropriate for pt's clinical situation. Dermatologic: No rashes or ulcers noted.  No changes consistent with cellulitis.   CBC Lab Results  Component Value Date   WBC 7.2 12/08/2023   HGB 15.1 12/08/2023   HCT 43.7 12/08/2023   MCV 87.6 12/08/2023   PLT 283 12/08/2023    BMET    Component Value Date/Time   NA 129 (L) 12/08/2023 1229   K 4.8 12/08/2023 1229   CL 90 (L) 12/08/2023 1229   CO2 27 12/08/2023 1229   GLUCOSE 108 (H) 12/08/2023 1229   BUN 10 12/08/2023 1229   CREATININE 0.91 12/08/2023 1229   CALCIUM 9.5 12/08/2023 1229   GFRNONAA >60 12/08/2023 1229   GFRAA >60 10/02/2018 0504   CrCl cannot be calculated (Patient's most recent lab result is older than the maximum 21 days allowed.).  COAG Lab Results  Component Value Date   INR 0.9 12/08/2023    Radiology No results found.   Assessment/Plan 1. Dissection of vertebral artery (Primary) The dissection appears to be stable. His labile hypertension is improved and he is becoming much more adept at adjusting his beta-blockers to the current situation.  Renal artery duplex is normal.  We will plan to rescan with an MRA in January.  - MR ANGIO HEAD WO W CONTRAST; Future - MR ANGIO NECK W WO CONTRAST; Future  2. Other secondary  hypertension Recommend:   Given patient's vertebral artery dissection optimal control of the patient's hypertension is important to prevent long term sequela.   BP is not acceptable today   The patient's vital signs suggest a possibility for renal artery stenosis.   Noninvasive studies are indicated at this time and a renal artery duplex will be ordered.  The patient will follow-up here in the office with the duplex ultrasound.   The patient will continue the current antihypertensive medications, no changes at this time.   The primary medical  service will continue aggressive antihypertensive therapy as per the AHA guidelines   3. Hyperlipidemia, mixed Continue statin as ordered and reviewed, no changes at this time  4. Closed burst fracture of lumbar vertebra, sequela Continue medications to treat the patient's degenerative disease as already ordered, these medications have been reviewed and there are no changes at this time.  Continued activity and therapy was stressed.    Cordella Shawl, MD  01/13/2024 7:40 PM

## 2024-01-14 ENCOUNTER — Ambulatory Visit (INDEPENDENT_AMBULATORY_CARE_PROVIDER_SITE_OTHER)

## 2024-01-14 ENCOUNTER — Encounter (INDEPENDENT_AMBULATORY_CARE_PROVIDER_SITE_OTHER): Payer: Self-pay | Admitting: Vascular Surgery

## 2024-01-14 ENCOUNTER — Ambulatory Visit (INDEPENDENT_AMBULATORY_CARE_PROVIDER_SITE_OTHER): Admitting: Vascular Surgery

## 2024-01-14 VITALS — BP 104/72 | HR 66 | Resp 18 | Wt 198.6 lb

## 2024-01-14 DIAGNOSIS — I158 Other secondary hypertension: Secondary | ICD-10-CM

## 2024-01-14 DIAGNOSIS — S32001S Stable burst fracture of unspecified lumbar vertebra, sequela: Secondary | ICD-10-CM

## 2024-01-14 DIAGNOSIS — E782 Mixed hyperlipidemia: Secondary | ICD-10-CM

## 2024-01-14 DIAGNOSIS — I7774 Dissection of vertebral artery: Secondary | ICD-10-CM | POA: Diagnosis not present

## 2024-01-20 ENCOUNTER — Encounter (INDEPENDENT_AMBULATORY_CARE_PROVIDER_SITE_OTHER): Payer: Self-pay | Admitting: Vascular Surgery

## 2024-03-03 ENCOUNTER — Ambulatory Visit (INDEPENDENT_AMBULATORY_CARE_PROVIDER_SITE_OTHER): Admitting: Vascular Surgery

## 2024-03-03 ENCOUNTER — Encounter (INDEPENDENT_AMBULATORY_CARE_PROVIDER_SITE_OTHER): Payer: Self-pay | Admitting: Vascular Surgery

## 2024-03-03 VITALS — BP 150/91 | HR 59 | Resp 17 | Ht 72.0 in | Wt 202.2 lb

## 2024-03-03 DIAGNOSIS — I158 Other secondary hypertension: Secondary | ICD-10-CM | POA: Diagnosis not present

## 2024-03-03 DIAGNOSIS — M150 Primary generalized (osteo)arthritis: Secondary | ICD-10-CM | POA: Diagnosis not present

## 2024-03-03 DIAGNOSIS — E782 Mixed hyperlipidemia: Secondary | ICD-10-CM

## 2024-03-03 DIAGNOSIS — I7774 Dissection of vertebral artery: Secondary | ICD-10-CM | POA: Diagnosis not present

## 2024-03-03 NOTE — Progress Notes (Signed)
 "                         MRN : [id number]  Derek Ford is a 58 y.o. (05/01/1965) male who presents with chief complaint of check carotid arteries.  History of Present Illness:   Patient returns to the office for follow-up regarding right vertebral artery dissection.  He notes his blood pressure has been better and it seems as though the wider swings in his blood pressure have resolved.  He also notes that because of the multiple antihypertensives he still has times where he goes low and this causes him to be somewhat dizzy.  Otherwise the neck pain and headache seems to be largely resolved.   Previous duplex ultrasound of the renal arteries demonstrates normal renal arteries there is no evidence for renal artery stenosis or other arterial abnormalities   Active Medications[1]  Past Medical History:  Diagnosis Date   Anxiety    Erectile dysfunction    Hypotestosteronism    Motion sickness    ocean ships   Rosacea    Sleep apnea    CPAP    Past Surgical History:  Procedure Laterality Date   APPENDECTOMY     COLONOSCOPY WITH PROPOFOL  N/A 04/12/2015   Procedure: COLONOSCOPY WITH PROPOFOL ;  Surgeon: Lamar ONEIDA Holmes, MD;  Location: [facility] ENDOSCOPY;  Service: Endoscopy;  Laterality: N/A;   COLONOSCOPY WITH PROPOFOL  N/A 06/30/2020   Procedure: COLONOSCOPY WITH PROPOFOL ;  Surgeon: Toledo, Ladell POUR, MD;  Location: [facility] ENDOSCOPY;  Service: Gastroenterology;  Laterality: N/A;   HARDWARE REMOVAL Right 05/12/2019   Procedure: HARDWARE REMOVAL RIGHT ANKLE;  Surgeon: Kendal Franky SQUIBB, MD;  Location: [facility] OR;  Service: Orthopedics;  Laterality: Right;  Abductor Canal and Popliteal Blocks per Anesthesia   HERNIA REPAIR Bilateral    Inguinal   ORIF ANKLE FRACTURE Right 10/03/2018   Procedure: OPEN REDUCTION INTERNAL FIXATION (ORIF) ANKLE FRACTURE;  Surgeon: Kendal Franky SQUIBB, MD;  Location: [facility] OR;  Service: Orthopedics;  Laterality: Right;   UVULOPLASTY N/A 10/24/2017   Procedure: COLEEN;   UvuloplastySurgeon: Milissa Hamming, MD;  Location: [facility];  Service: ENT;  Laterality: N/A;    Social History Social History[2]  Family History No family history on file.  Allergies[3]   REVIEW OF SYSTEMS (Negative unless checked)  Constitutional: [] Weight loss  [] Fever  [] Chills Cardiac: [] Chest pain   [] Chest pressure   [] Palpitations   [] Shortness of breath when laying flat   [] Shortness of breath with exertion. Vascular:  [x] Pain in legs with walking   [] Pain in legs at rest  [] History of DVT   [] Phlebitis   [] Swelling in legs   [] Varicose veins   [] Non-healing ulcers Pulmonary:   [] Uses home oxygen   [] Productive cough   [] Hemoptysis   [] Wheeze  [] COPD   [] Asthma Neurologic:  [] Dizziness   [] Seizures   [] History of stroke   [] History of TIA  [] Aphasia   [] Vissual changes   [] Weakness or numbness in arm   [] Weakness or numbness in leg Musculoskeletal:   [] Joint swelling   [] Joint pain   [] Low back pain Hematologic:  [] Easy bruising  [] Easy bleeding   [] Hypercoagulable state   [] Anemic Gastrointestinal:  [] Diarrhea   [] Vomiting  [] Gastroesophageal reflux/heartburn   [] Difficulty swallowing. Genitourinary:  [] Chronic kidney disease   [] Difficult urination  [] Frequent urination   [] Blood in urine Skin:  [] Rashes   [] Ulcers  Psychological:  [] History of anxiety   []  History of major  depression.  Physical Examination  Vitals:   03/03/24 0922  BP: (!) 150/91  Pulse: (!) 59  Resp: 17  Weight: 202 lb 3.2 oz (91.7 kg)  Height: 6' (1.829 m)   Body mass index is 27.42 kg/m. Gen: WD/WN, NAD Head: [place]/AT, No temporalis wasting.  Ear/Nose/Throat: Hearing grossly intact, nares w/o erythema or drainage Eyes: PER, EOMI, sclera nonicteric.  Neck: Supple, no masses.  No bruit or JVD.  Pulmonary:  Good air movement, no audible wheezing, no use of accessory muscles.  Cardiac: RRR, normal S1, S2, no Murmurs. Vascular:  carotid bruit noted Vessel Right Left  Radial Palpable  Palpable  Carotid  Palpable  Palpable  Subclav  Palpable Palpable  Gastrointestinal: soft, non-distended. No guarding/no peritoneal signs.  Musculoskeletal: M/S 5/5 throughout.  No visible deformity.  Neurologic: CN 2-12 intact. Pain and light touch intact in extremities.  Symmetrical.  Speech is fluent. Motor exam as listed above. Psychiatric: Judgment intact, Mood & affect appropriate for pt's clinical situation. Dermatologic: No rashes or ulcers noted.  No changes consistent with cellulitis.   CBC Lab Results  Component Value Date   WBC 7.2 12/08/2023   HGB 15.1 12/08/2023   HCT 43.7 12/08/2023   MCV 87.6 12/08/2023   PLT 283 12/08/2023    BMET    Component Value Date/Time   NA 129 (L) 12/08/2023 1229   K 4.8 12/08/2023 1229   CL 90 (L) 12/08/2023 1229   CO2 27 12/08/2023 1229   GLUCOSE 108 (H) 12/08/2023 1229   BUN 10 12/08/2023 1229   CREATININE 0.91 12/08/2023 1229   CALCIUM 9.5 12/08/2023 1229   GFRNONAA >60 12/08/2023 1229   GFRAA >60 10/02/2018 0504   CrCl cannot be calculated (Patient's most recent lab result is older than the maximum 21 days allowed.).  COAG Lab Results  Component Value Date   INR 0.9 12/08/2023    Radiology No results found.   Assessment/Plan 1. Vertebral artery dissection (Primary) The dissection appears to be stable. His labile hypertension is resolved and he is adept at adjusting his beta-blockers to the current situation.  Renal artery duplex is normal.  We will plan to rescan with an MRA in March.   2. Other secondary hypertension Recommend:   Given patient's vertebral artery dissection optimal control of the patient's hypertension is important to prevent long term sequela.   BP is acceptable at home   There is no evidence for renal artery stenosis.   The patient will continue the current antihypertensive medications, changes per Dr. Cleotilde.   The primary medical service will continue aggressive antihypertensive therapy as  per the AHA guidelines   3. Primary osteoarthritis involving multiple joints Continue medications to treat the patient's degenerative disease as already ordered, these medications have been reviewed and there are no changes at this time.   Continued activity and therapy was stressed.  4. Hyperlipidemia, mixed Continue statin as ordered and reviewed, no changes at this time     Cordella Shawl, MD  03/03/2024 9:28 AM      [1]  Current Meds  Medication Sig   acetaminophen  (TYLENOL ) 500 MG tablet Take 1,000 mg by mouth every 8 (eight) hours as needed for moderate pain (pain score 4-6).   aspirin  EC 81 MG tablet Take 1 tablet (81 mg total) by mouth daily.   AUVELITY 45-105 MG TBCR Take 1 tablet by mouth 2 (two) times daily.   carvedilol (COREG) 25 MG tablet Take 25 mg by mouth 2 (two)  times daily with a meal.   clopidogrel  (PLAVIX ) 75 MG tablet Take 1 tablet (75 mg total) by mouth daily.   EPINEPHrine  0.3 mg/0.3 mL IJ SOAJ injection Inject 0.3 mg into the muscle as needed for anaphylaxis.   fluticasone (FLONASE) 50 MCG/ACT nasal spray Place 1 spray into both nostrils daily.   ibuprofen  (ADVIL ) 200 MG tablet Take 800 mg by mouth.   loratadine-pseudoephedrine (CLARITIN-D 24-HOUR) 10-240 MG 24 hr tablet Take 1 tablet by mouth daily as needed for allergies (congestion).    montelukast (SINGULAIR) 10 MG tablet Take 10 mg by mouth daily.   pravastatin (PRAVACHOL) 20 MG tablet Take 20 mg by mouth at bedtime.   sildenafil (REVATIO) 20 MG tablet Take 20 mg by mouth daily as needed. Prior to sex  [2]  Social History Tobacco Use   Smoking status: Former    Types: Cigarettes   Smokeless tobacco: Former    Types: Chew, Snuff    Quit date: 04/2023   Tobacco comments:    quit over 30 yrs ago  Vaping Use   Vaping status: Never Used  Substance Use Topics   Alcohol use: No    Comment: may drink 2x/year   Drug use: No  [3] No Known Allergies  "

## 2024-03-05 NOTE — Addendum Note (Signed)
 Addended by: Ebony Yorio on: 03/05/2024 08:35 AM   Modules accepted: Orders

## 2024-05-29 ENCOUNTER — Ambulatory Visit (INDEPENDENT_AMBULATORY_CARE_PROVIDER_SITE_OTHER): Admitting: Vascular Surgery

## 2024-06-05 ENCOUNTER — Ambulatory Visit: Admitting: Neuroradiology
